# Patient Record
Sex: Female | Born: 1940 | Race: Black or African American | Hispanic: No | Marital: Married | Smoking: Never smoker
Health system: Southern US, Community
[De-identification: ages and names within clinical notes are randomized; demographics above are authoritative.]

## PROBLEM LIST (undated history)

## (undated) DIAGNOSIS — E079 Disorder of thyroid, unspecified: Secondary | ICD-10-CM

## (undated) DIAGNOSIS — I10 Essential (primary) hypertension: Secondary | ICD-10-CM

## (undated) DIAGNOSIS — F32A Depression, unspecified: Secondary | ICD-10-CM

## (undated) DIAGNOSIS — K5792 Diverticulitis of intestine, part unspecified, without perforation or abscess without bleeding: Secondary | ICD-10-CM

## (undated) DIAGNOSIS — D638 Anemia in other chronic diseases classified elsewhere: Secondary | ICD-10-CM

## (undated) DIAGNOSIS — K529 Noninfective gastroenteritis and colitis, unspecified: Principal | ICD-10-CM

## (undated) DIAGNOSIS — E785 Hyperlipidemia, unspecified: Secondary | ICD-10-CM

## (undated) DIAGNOSIS — K5790 Diverticulosis of intestine, part unspecified, without perforation or abscess without bleeding: Secondary | ICD-10-CM

## (undated) DIAGNOSIS — N189 Chronic kidney disease, unspecified: Secondary | ICD-10-CM

## (undated) DIAGNOSIS — F0390 Unspecified dementia without behavioral disturbance: Secondary | ICD-10-CM

## (undated) DIAGNOSIS — Z974 Presence of external hearing-aid: Secondary | ICD-10-CM

## (undated) DIAGNOSIS — K861 Other chronic pancreatitis: Secondary | ICD-10-CM

## (undated) DIAGNOSIS — R42 Dizziness and giddiness: Secondary | ICD-10-CM

## (undated) DIAGNOSIS — K219 Gastro-esophageal reflux disease without esophagitis: Secondary | ICD-10-CM

## (undated) DIAGNOSIS — F329 Major depressive disorder, single episode, unspecified: Secondary | ICD-10-CM

## (undated) HISTORY — DX: Hyperlipidemia, unspecified: E78.5

## (undated) HISTORY — DX: Depression, unspecified: F32.A

## (undated) HISTORY — DX: Disorder of thyroid, unspecified: E07.9

## (undated) HISTORY — DX: Diverticulitis of intestine, part unspecified, without perforation or abscess without bleeding: K57.92

## (undated) HISTORY — PX: NISSEN FUNDOPLICATION: SHX2091

## (undated) HISTORY — DX: Presence of external hearing-aid: Z97.4

## (undated) HISTORY — PX: ABDOMINAL EXPLORATION SURGERY: SHX538

## (undated) HISTORY — DX: Noninfective gastroenteritis and colitis, unspecified: K52.9

## (undated) HISTORY — DX: Diverticulosis of intestine, part unspecified, without perforation or abscess without bleeding: K57.90

## (undated) HISTORY — DX: Major depressive disorder, single episode, unspecified: F32.9

## (undated) HISTORY — DX: Anemia in other chronic diseases classified elsewhere: D63.8

## (undated) HISTORY — DX: Unspecified dementia without behavioral disturbance: F03.90

## (undated) HISTORY — DX: Other chronic pancreatitis: K86.1

## (undated) HISTORY — DX: Gastro-esophageal reflux disease without esophagitis: K21.9

## (undated) HISTORY — DX: Dizziness and giddiness: R42

## (undated) HISTORY — DX: Chronic kidney disease, unspecified: N18.9

---

## 1999-02-13 ENCOUNTER — Ambulatory Visit (HOSPITAL_COMMUNITY): Admission: RE | Admit: 1999-02-13 | Discharge: 1999-02-13 | Payer: Self-pay | Admitting: Internal Medicine

## 1999-03-20 ENCOUNTER — Ambulatory Visit (HOSPITAL_COMMUNITY): Admission: RE | Admit: 1999-03-20 | Discharge: 1999-03-20 | Payer: Self-pay | Admitting: Internal Medicine

## 1999-08-29 ENCOUNTER — Encounter: Payer: Self-pay | Admitting: Cardiology

## 1999-08-29 ENCOUNTER — Ambulatory Visit (HOSPITAL_COMMUNITY): Admission: RE | Admit: 1999-08-29 | Discharge: 1999-08-29 | Payer: Self-pay | Admitting: Cardiology

## 1999-09-24 ENCOUNTER — Encounter: Payer: Self-pay | Admitting: Surgery

## 1999-09-24 ENCOUNTER — Ambulatory Visit (HOSPITAL_COMMUNITY): Admission: RE | Admit: 1999-09-24 | Discharge: 1999-09-24 | Payer: Self-pay | Admitting: Surgery

## 1999-10-05 ENCOUNTER — Encounter: Payer: Self-pay | Admitting: Surgery

## 1999-10-09 ENCOUNTER — Inpatient Hospital Stay (HOSPITAL_COMMUNITY): Admission: RE | Admit: 1999-10-09 | Discharge: 1999-10-12 | Payer: Self-pay | Admitting: Surgery

## 1999-11-19 HISTORY — PX: CHOLECYSTECTOMY: SHX55

## 2000-01-31 ENCOUNTER — Encounter: Admission: RE | Admit: 2000-01-31 | Discharge: 2000-01-31 | Payer: Self-pay | Admitting: Surgery

## 2000-01-31 ENCOUNTER — Encounter: Payer: Self-pay | Admitting: Surgery

## 2000-02-22 ENCOUNTER — Inpatient Hospital Stay (HOSPITAL_COMMUNITY): Admission: EM | Admit: 2000-02-22 | Discharge: 2000-02-27 | Payer: Self-pay | Admitting: Surgery

## 2000-02-22 ENCOUNTER — Encounter: Payer: Self-pay | Admitting: Surgery

## 2000-02-23 ENCOUNTER — Encounter: Payer: Self-pay | Admitting: Surgery

## 2000-03-05 ENCOUNTER — Encounter: Payer: Self-pay | Admitting: Internal Medicine

## 2000-03-05 ENCOUNTER — Inpatient Hospital Stay (HOSPITAL_COMMUNITY): Admission: EM | Admit: 2000-03-05 | Discharge: 2000-03-19 | Payer: Self-pay | Admitting: Internal Medicine

## 2000-03-13 ENCOUNTER — Encounter: Payer: Self-pay | Admitting: Internal Medicine

## 2000-03-19 ENCOUNTER — Encounter: Payer: Self-pay | Admitting: Surgery

## 2000-04-07 ENCOUNTER — Inpatient Hospital Stay (HOSPITAL_COMMUNITY): Admission: AD | Admit: 2000-04-07 | Discharge: 2000-04-09 | Payer: Self-pay | Admitting: Internal Medicine

## 2000-04-08 ENCOUNTER — Encounter: Payer: Self-pay | Admitting: Internal Medicine

## 2000-06-12 ENCOUNTER — Ambulatory Visit (HOSPITAL_COMMUNITY): Admission: RE | Admit: 2000-06-12 | Discharge: 2000-06-12 | Payer: Self-pay | Admitting: Internal Medicine

## 2000-06-12 HISTORY — PX: COLONOSCOPY: SHX174

## 2001-04-30 ENCOUNTER — Encounter: Payer: Self-pay | Admitting: Emergency Medicine

## 2001-04-30 ENCOUNTER — Emergency Department (HOSPITAL_COMMUNITY): Admission: EM | Admit: 2001-04-30 | Discharge: 2001-04-30 | Payer: Self-pay | Admitting: Emergency Medicine

## 2001-06-12 ENCOUNTER — Encounter: Payer: Self-pay | Admitting: Neurology

## 2001-06-12 ENCOUNTER — Ambulatory Visit (HOSPITAL_COMMUNITY): Admission: RE | Admit: 2001-06-12 | Discharge: 2001-06-12 | Payer: Self-pay | Admitting: Neurology

## 2001-06-19 ENCOUNTER — Ambulatory Visit (HOSPITAL_COMMUNITY): Admission: RE | Admit: 2001-06-19 | Discharge: 2001-06-19 | Payer: Self-pay | Admitting: Neurology

## 2001-06-19 ENCOUNTER — Encounter: Payer: Self-pay | Admitting: Neurology

## 2003-01-07 ENCOUNTER — Ambulatory Visit (HOSPITAL_COMMUNITY): Admission: RE | Admit: 2003-01-07 | Discharge: 2003-01-07 | Payer: Self-pay | Admitting: Otolaryngology

## 2003-01-07 ENCOUNTER — Encounter: Payer: Self-pay | Admitting: Otolaryngology

## 2003-09-14 ENCOUNTER — Encounter (HOSPITAL_COMMUNITY): Admission: RE | Admit: 2003-09-14 | Discharge: 2003-10-14 | Payer: Self-pay | Admitting: Oncology

## 2003-09-14 ENCOUNTER — Encounter: Admission: RE | Admit: 2003-09-14 | Discharge: 2003-09-14 | Payer: Self-pay | Admitting: Oncology

## 2003-12-07 ENCOUNTER — Encounter: Admission: RE | Admit: 2003-12-07 | Discharge: 2003-12-07 | Payer: Self-pay | Admitting: Oncology

## 2003-12-07 ENCOUNTER — Encounter (HOSPITAL_COMMUNITY): Admission: RE | Admit: 2003-12-07 | Discharge: 2004-01-06 | Payer: Self-pay | Admitting: Oncology

## 2004-05-02 ENCOUNTER — Encounter: Admission: RE | Admit: 2004-05-02 | Discharge: 2004-05-02 | Payer: Self-pay | Admitting: Oncology

## 2004-05-02 ENCOUNTER — Encounter (HOSPITAL_COMMUNITY): Admission: RE | Admit: 2004-05-02 | Discharge: 2004-06-01 | Payer: Self-pay | Admitting: Oncology

## 2004-10-17 ENCOUNTER — Encounter (HOSPITAL_COMMUNITY): Admission: RE | Admit: 2004-10-17 | Discharge: 2004-11-16 | Payer: Self-pay | Admitting: Oncology

## 2004-10-17 ENCOUNTER — Ambulatory Visit (HOSPITAL_COMMUNITY): Payer: Self-pay | Admitting: Oncology

## 2004-10-17 ENCOUNTER — Encounter: Admission: RE | Admit: 2004-10-17 | Discharge: 2004-11-16 | Payer: Self-pay | Admitting: Oncology

## 2004-11-15 ENCOUNTER — Ambulatory Visit (HOSPITAL_COMMUNITY): Admission: RE | Admit: 2004-11-15 | Discharge: 2004-11-15 | Payer: Self-pay | Admitting: Internal Medicine

## 2005-09-10 ENCOUNTER — Emergency Department (HOSPITAL_COMMUNITY): Admission: EM | Admit: 2005-09-10 | Discharge: 2005-09-10 | Payer: Self-pay | Admitting: Emergency Medicine

## 2005-10-07 ENCOUNTER — Ambulatory Visit: Payer: Self-pay | Admitting: Internal Medicine

## 2005-10-16 ENCOUNTER — Ambulatory Visit: Payer: Self-pay | Admitting: Internal Medicine

## 2006-10-24 ENCOUNTER — Ambulatory Visit (HOSPITAL_COMMUNITY): Admission: RE | Admit: 2006-10-24 | Discharge: 2006-10-24 | Payer: Self-pay | Admitting: Internal Medicine

## 2007-08-06 ENCOUNTER — Ambulatory Visit (HOSPITAL_COMMUNITY): Admission: RE | Admit: 2007-08-06 | Discharge: 2007-08-06 | Payer: Self-pay

## 2007-08-31 IMAGING — CR DG CHEST 2V
2 series · 2 of 2 positions shown · non-contrast
Comparison: none

CLINICAL DATA: Left-sided chest pain.
 CHEST - 2 VIEW:

[view not recorded (1 of 2)]
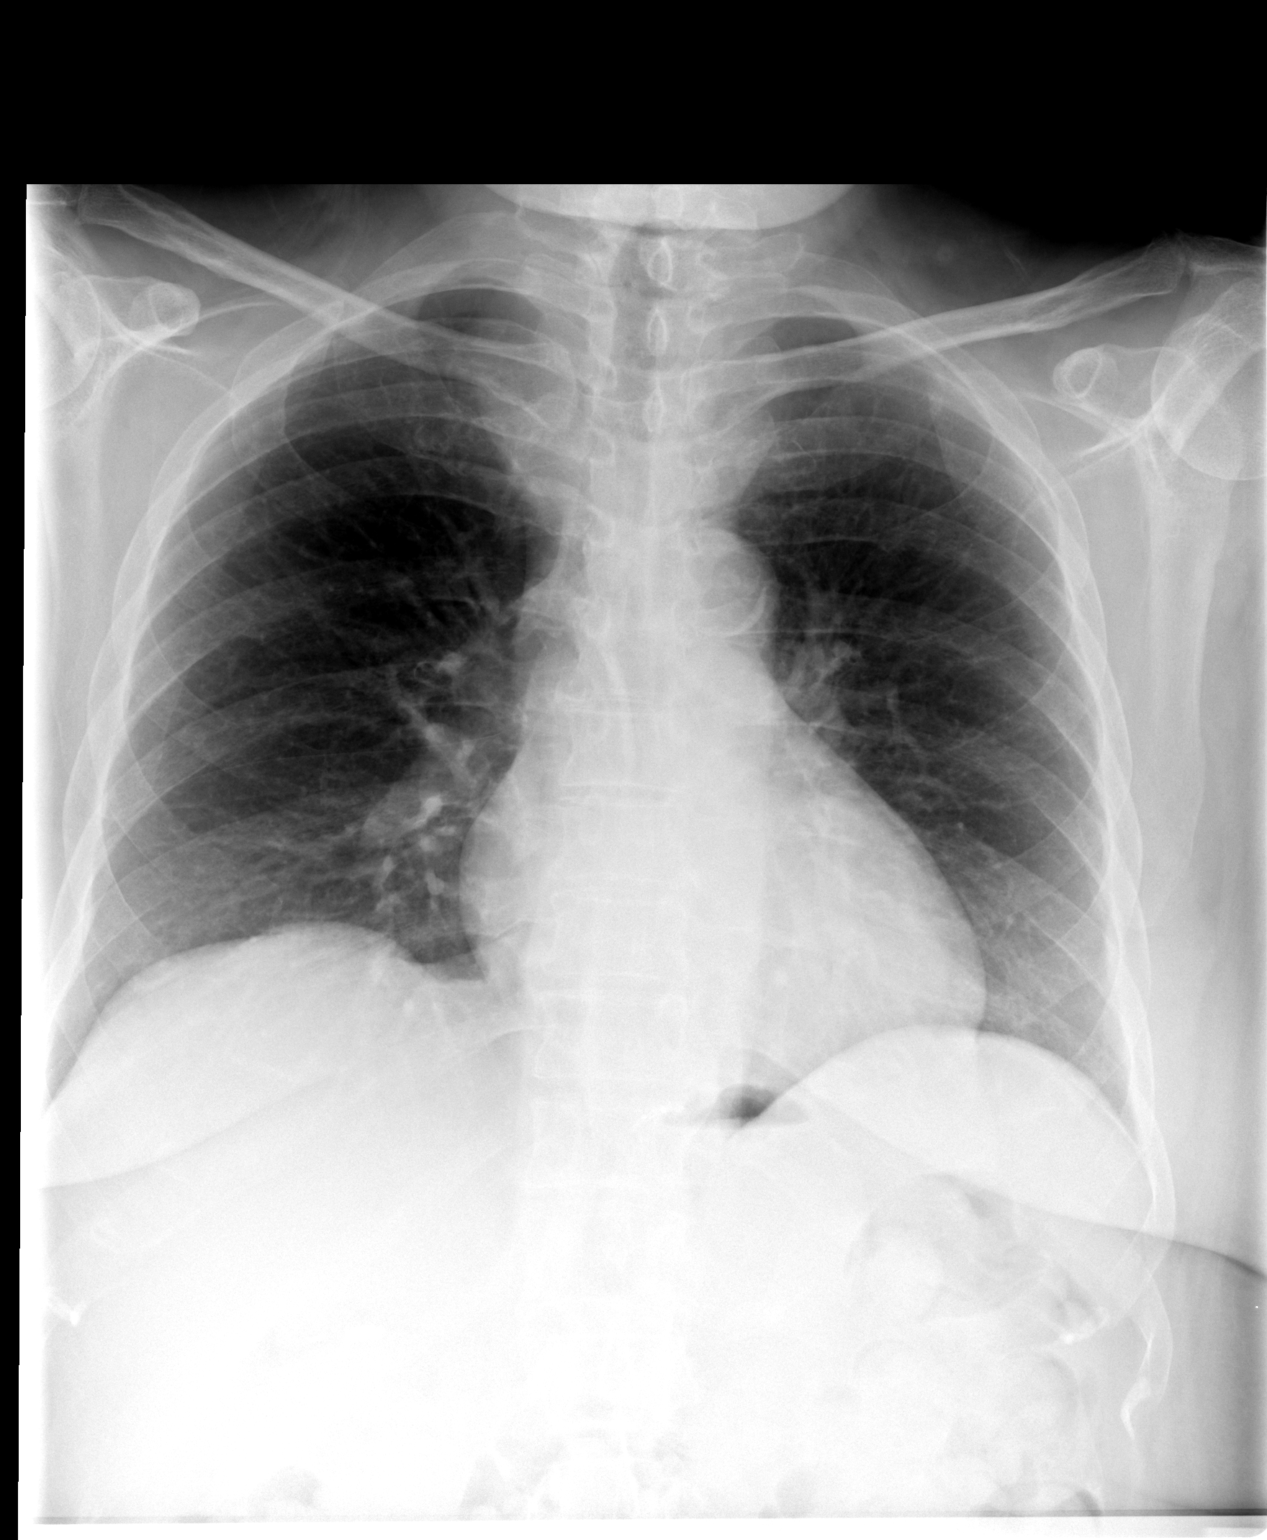

[view not recorded (2 of 2)]
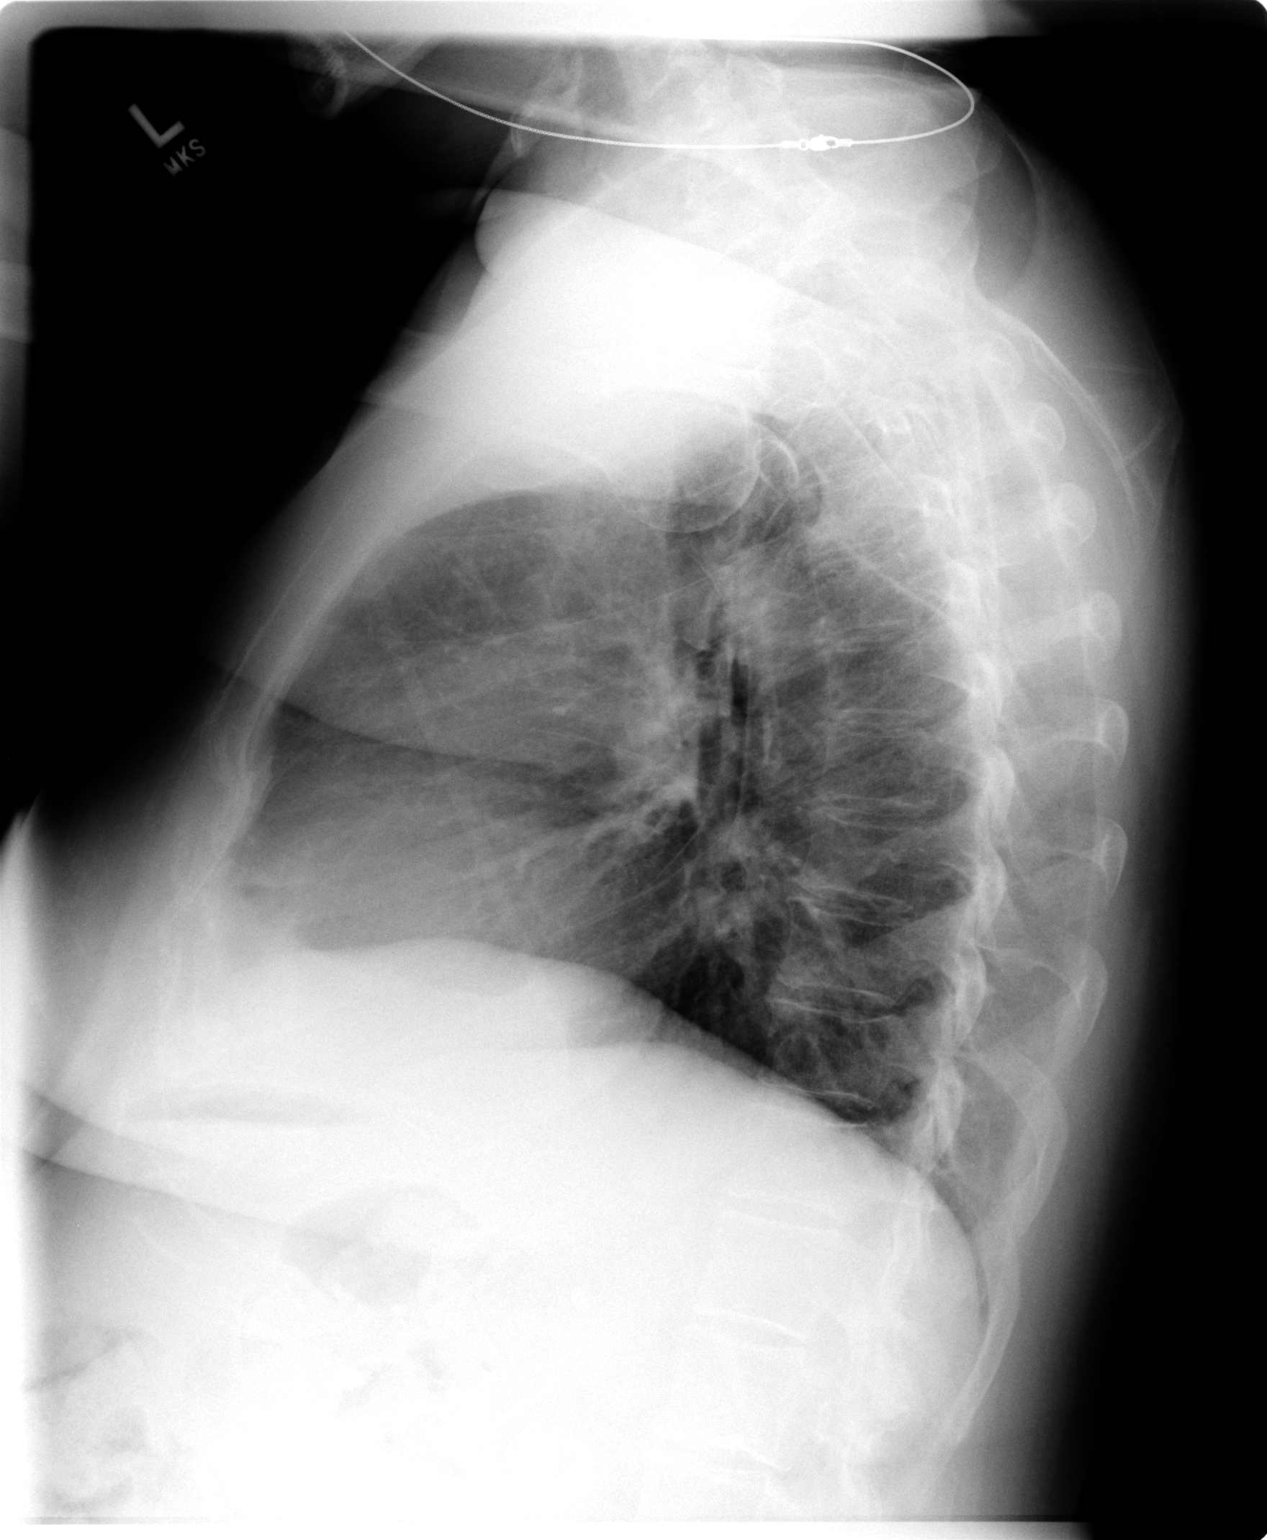

[2 of 2 positions shown; findings below may reference images not displayed]

FINDINGS: The lungs are clear.  The heart is normal.  Atherosclerotic changes are seen within the aorta.  Degenerative changes are noted within the thoracic spine.
IMPRESSION: Negative for acute cardiac or pulmonary process.

## 2007-08-31 IMAGING — CT CT ABDOMEN W/O CM
1 of 2 series · 15 of 32 positions shown, 19 images · IV contrast (agent unspecified)
Comparison: none

CLINICAL DATA: Abdominal and pelvic pain.  History of chronic pancreatitis.  Previous abdominal surgery. 
 ABDOMEN CT WITHOUT CONTRAST:
TECHNIQUE: Multidetector CT imaging of the abdomen was performed following the standard protocol without IV contrast.
TECHNIQUE: Multidetector CT imaging of the pelvis was performed following the standard protocol without IV contrast.

[Series 3247: — · axial · 0.81mm/px · z∈[+1248,+1682]mm · 15 of 95 slices shown, 19 images]
[im 4/95  soft-tissue]
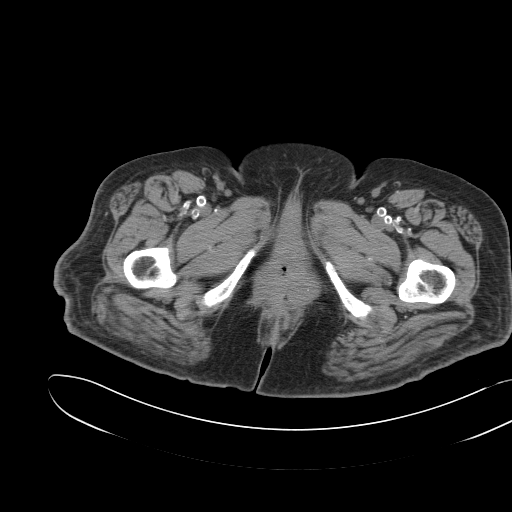
[im 4/95  bone]
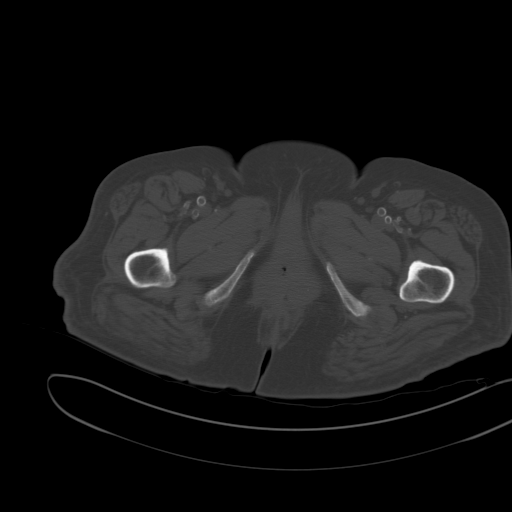
[im 12/95  soft-tissue]
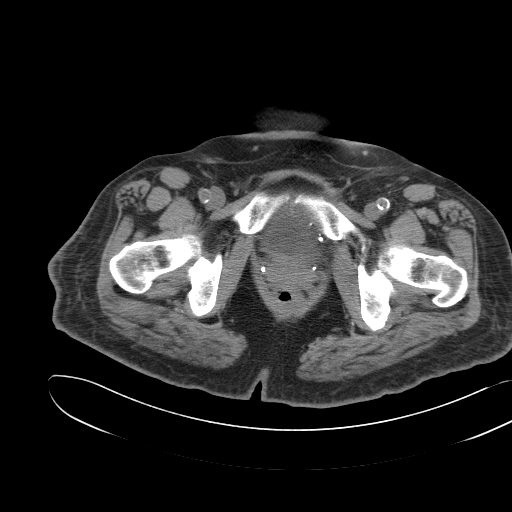
[im 20/95  soft-tissue]
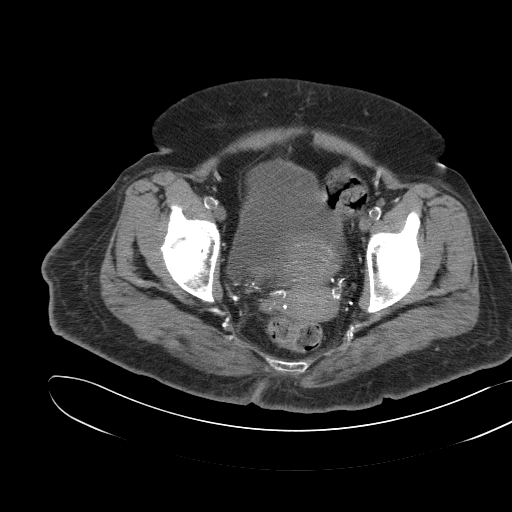
[im 28/95  soft-tissue]
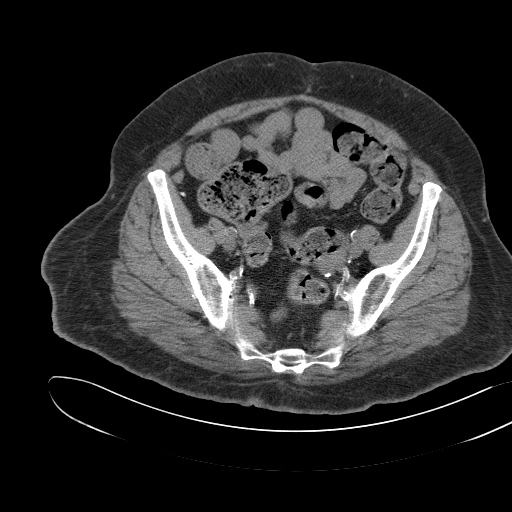
[im 32/95  soft-tissue]
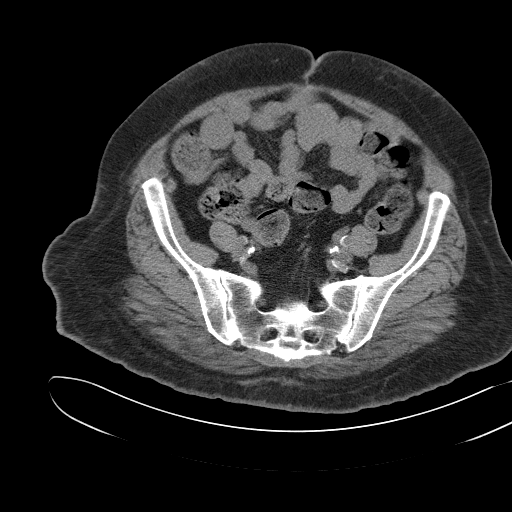
[im 40/95  soft-tissue]
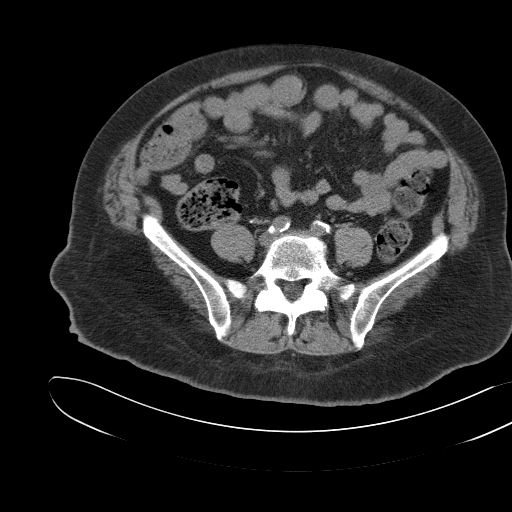
[im 48/95  soft-tissue]
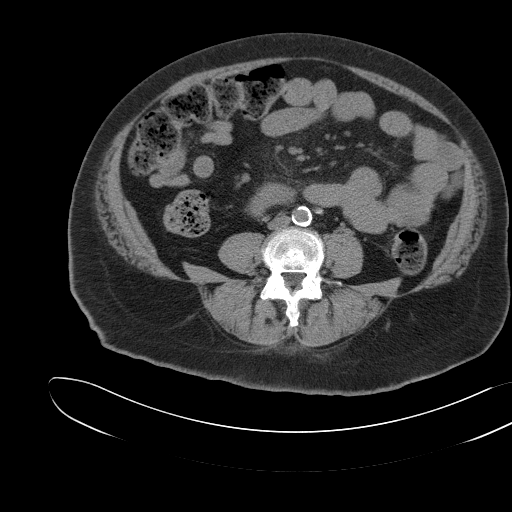
[im 55/95  soft-tissue]
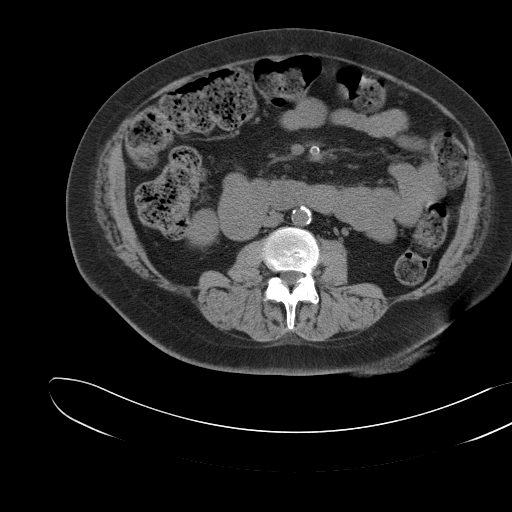
[im 63/95  soft-tissue]
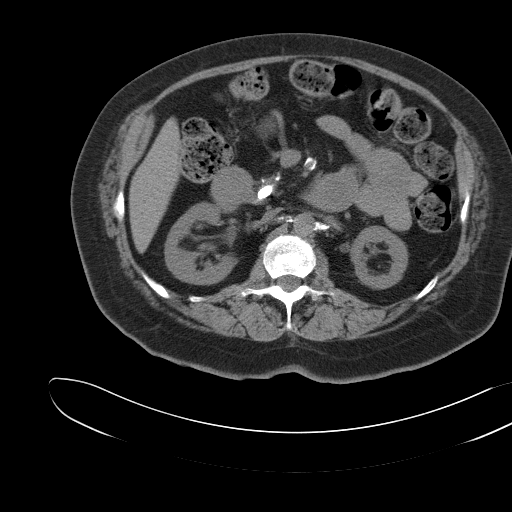
[im 63/95  bone]
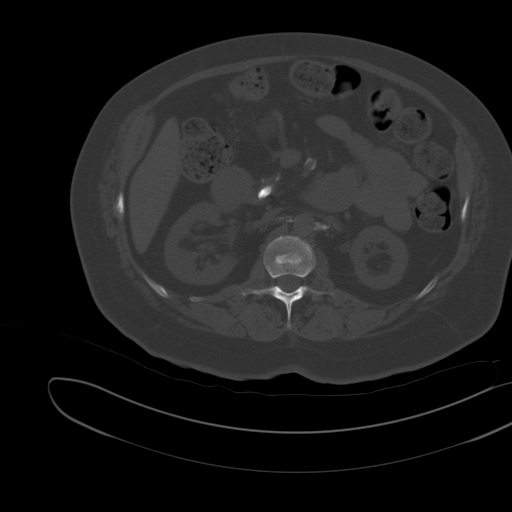
[im 67/95  soft-tissue]
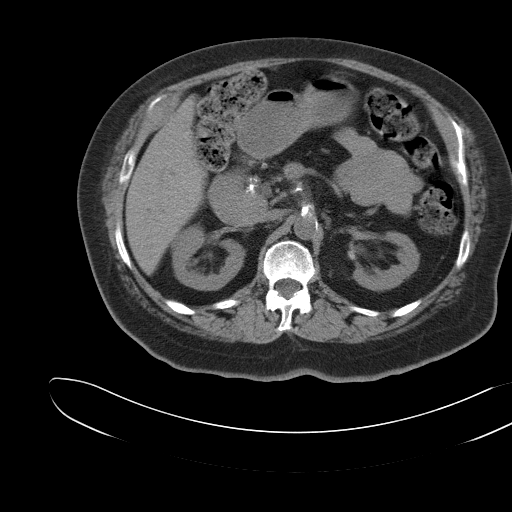
[im 75/95  soft-tissue]
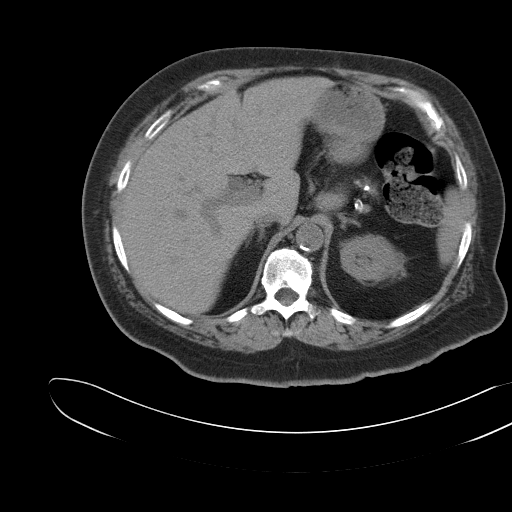
[im 79/95  lung]
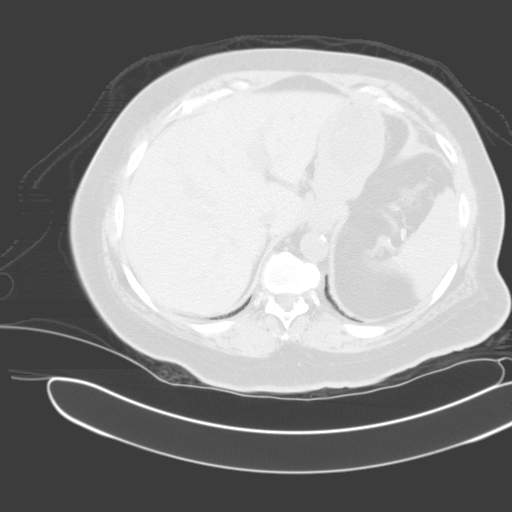
[im 83/95  soft-tissue]
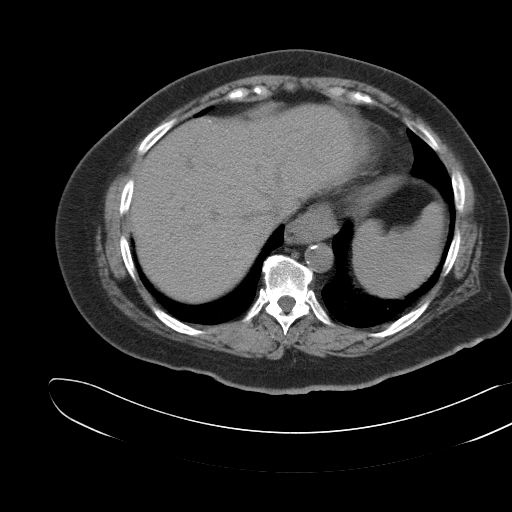
[im 83/95  lung]
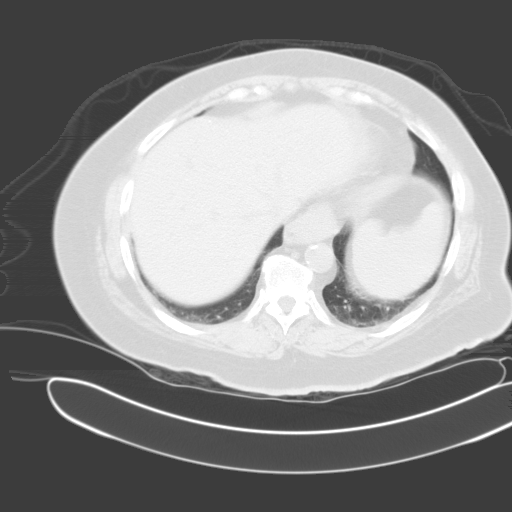
[im 87/95  lung]
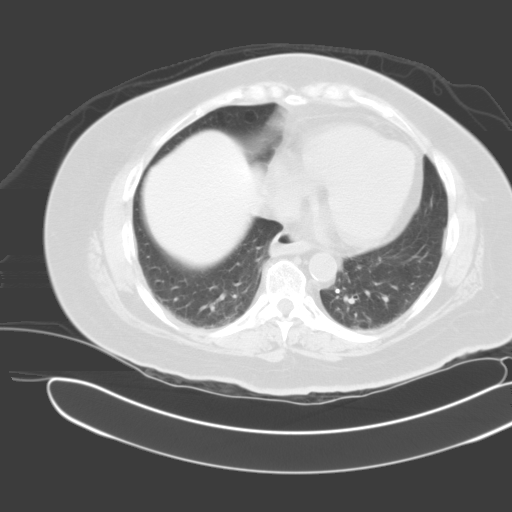
[im 91/95  soft-tissue]
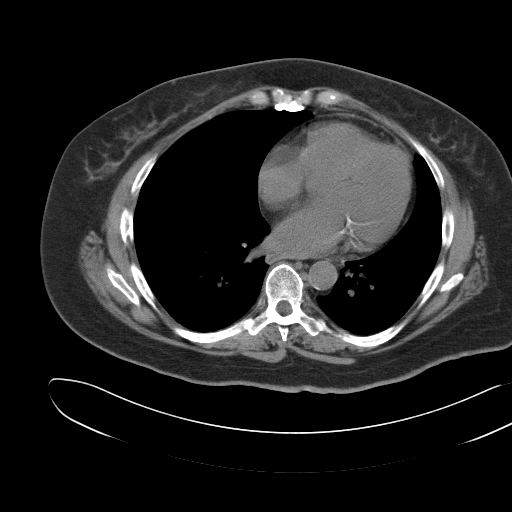
[im 91/95  lung]
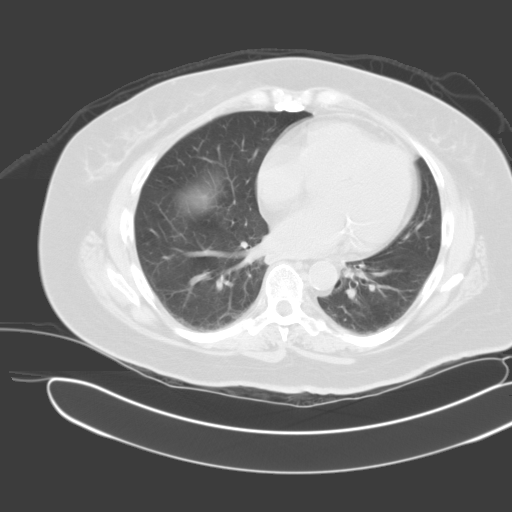

[15 of 32 positions shown; findings below may reference images not displayed]

FINDINGS: Hiatal hernia is noted.  Evidence of gastric surgery present.  The liver, spleen, adrenal glands are unremarkable.  Mild cortical renal volume loss is noted.  No evidence of hydronephrosis or urinary calculi.  There is atrophy of the pancreas with  multiple pancreatic calcifications compatible with chronic pancreatitis.  No evidence of acute inflammation is identified.  The patient is status-post cholecystectomy.  Please note that parenchymal lesions within the solid viscera may be missed as intravenous contrast was not administered.  No free fluid, enlarged lymph nodes, abdominal aortic aneurysm, or biliary dilatation.  There is heavy atherosclerotic calcification within the celiac and renal arteries as well as SMA and IMA.  Visualized bowel is within normal limits.  The appendix is normal.
IMPRESSION: 1.  No definite acute abnormality. 
 2.  Evidence of chronic pancreatitis and cholecystectomy. 
 3.  Heavy atherosclerotic calcifications in the renal and mesenteric vessels.  
 PELVIS CT WITHOUT CONTRAST:
FINDINGS: The bowel and bladder are within normal limits.  No free fluid or enlarged lymph nodes identified.  Mild degenerative changes in the lower lumbar spine and SI joints noted.
IMPRESSION: No acute abnormality.

## 2007-10-30 ENCOUNTER — Ambulatory Visit (HOSPITAL_COMMUNITY): Admission: RE | Admit: 2007-10-30 | Discharge: 2007-10-30 | Payer: Self-pay | Admitting: Internal Medicine

## 2007-11-04 ENCOUNTER — Ambulatory Visit (HOSPITAL_COMMUNITY): Admission: RE | Admit: 2007-11-04 | Discharge: 2007-11-04 | Payer: Self-pay | Admitting: Internal Medicine

## 2008-06-07 ENCOUNTER — Encounter (HOSPITAL_COMMUNITY): Admission: RE | Admit: 2008-06-07 | Discharge: 2008-07-07 | Payer: Self-pay | Admitting: Neurology

## 2008-11-01 ENCOUNTER — Ambulatory Visit (HOSPITAL_COMMUNITY): Admission: RE | Admit: 2008-11-01 | Discharge: 2008-11-01 | Payer: Self-pay | Admitting: Internal Medicine

## 2008-11-04 ENCOUNTER — Other Ambulatory Visit: Admission: RE | Admit: 2008-11-04 | Discharge: 2008-11-04 | Payer: Self-pay | Admitting: Obstetrics and Gynecology

## 2008-11-20 ENCOUNTER — Emergency Department (HOSPITAL_COMMUNITY): Admission: EM | Admit: 2008-11-20 | Discharge: 2008-11-20 | Payer: Self-pay | Admitting: Emergency Medicine

## 2009-11-07 ENCOUNTER — Ambulatory Visit (HOSPITAL_COMMUNITY): Admission: RE | Admit: 2009-11-07 | Discharge: 2009-11-07 | Payer: Self-pay | Admitting: Internal Medicine

## 2010-02-08 ENCOUNTER — Other Ambulatory Visit: Admission: RE | Admit: 2010-02-08 | Discharge: 2010-02-08 | Payer: Self-pay | Admitting: Obstetrics and Gynecology

## 2010-06-07 ENCOUNTER — Emergency Department (HOSPITAL_COMMUNITY): Admission: EM | Admit: 2010-06-07 | Discharge: 2010-06-07 | Payer: Self-pay | Admitting: Emergency Medicine

## 2010-09-22 ENCOUNTER — Observation Stay (HOSPITAL_COMMUNITY)
Admission: EM | Admit: 2010-09-22 | Discharge: 2010-09-24 | Payer: Self-pay | Source: Home / Self Care | Admitting: Emergency Medicine

## 2010-11-08 ENCOUNTER — Ambulatory Visit (HOSPITAL_COMMUNITY)
Admission: RE | Admit: 2010-11-08 | Discharge: 2010-11-08 | Payer: Self-pay | Source: Home / Self Care | Attending: Internal Medicine | Admitting: Internal Medicine

## 2010-12-06 ENCOUNTER — Ambulatory Visit (HOSPITAL_COMMUNITY)
Admission: RE | Admit: 2010-12-06 | Discharge: 2010-12-06 | Payer: Self-pay | Source: Home / Self Care | Attending: Internal Medicine | Admitting: Internal Medicine

## 2010-12-09 ENCOUNTER — Encounter: Payer: Self-pay | Admitting: Internal Medicine

## 2010-12-09 ENCOUNTER — Encounter: Payer: Self-pay | Admitting: Orthopedic Surgery

## 2010-12-13 ENCOUNTER — Ambulatory Visit (HOSPITAL_COMMUNITY)
Admission: RE | Admit: 2010-12-13 | Discharge: 2010-12-13 | Payer: Self-pay | Source: Home / Self Care | Attending: Internal Medicine | Admitting: Internal Medicine

## 2010-12-21 ENCOUNTER — Other Ambulatory Visit: Payer: Self-pay

## 2010-12-21 DIAGNOSIS — E042 Nontoxic multinodular goiter: Secondary | ICD-10-CM

## 2010-12-26 ENCOUNTER — Encounter (HOSPITAL_COMMUNITY): Payer: Self-pay

## 2010-12-26 ENCOUNTER — Encounter (HOSPITAL_COMMUNITY)
Admission: RE | Admit: 2010-12-26 | Discharge: 2010-12-26 | Disposition: A | Discharge status: Home / Self Care | Payer: Medicare Other | Source: Ambulatory Visit | Attending: "Endocrinology | Admitting: "Endocrinology

## 2010-12-26 DIAGNOSIS — E042 Nontoxic multinodular goiter: Secondary | ICD-10-CM

## 2010-12-26 HISTORY — DX: Essential (primary) hypertension: I10

## 2010-12-26 MED ORDER — SODIUM PERTECHNETATE TC 99M INJECTION
10.0000 | Freq: Once | INTRAVENOUS | Status: DC | PRN
  Administered 2010-12-27: 10.1 via INTRAVENOUS

## 2010-12-27 MED ORDER — SODIUM IODIDE I 131 CAPSULE
10.0000 | Freq: Once | INTRAVENOUS | Status: AC | PRN
  Administered 2010-12-26: 7 via ORAL

## 2010-12-28 ENCOUNTER — Other Ambulatory Visit (HOSPITAL_COMMUNITY): Payer: Self-pay | Admitting: Internal Medicine

## 2010-12-28 ENCOUNTER — Other Ambulatory Visit (HOSPITAL_COMMUNITY): Payer: Self-pay | Admitting: "Endocrinology

## 2010-12-28 DIAGNOSIS — E049 Nontoxic goiter, unspecified: Secondary | ICD-10-CM

## 2011-01-01 ENCOUNTER — Ambulatory Visit (HOSPITAL_COMMUNITY)
Admission: RE | Admit: 2011-01-01 | Discharge: 2011-01-01 | Disposition: A | Discharge status: Home / Self Care | Payer: Medicare Other | Source: Ambulatory Visit | Attending: Internal Medicine | Admitting: Internal Medicine

## 2011-01-01 ENCOUNTER — Other Ambulatory Visit (HOSPITAL_COMMUNITY): Payer: Self-pay | Admitting: Internal Medicine

## 2011-01-01 ENCOUNTER — Other Ambulatory Visit: Payer: Self-pay | Admitting: Radiology

## 2011-01-01 DIAGNOSIS — E049 Nontoxic goiter, unspecified: Secondary | ICD-10-CM

## 2011-01-01 DIAGNOSIS — E042 Nontoxic multinodular goiter: Secondary | ICD-10-CM | POA: Insufficient documentation

## 2011-01-29 LAB — GLUCOSE, CAPILLARY
Glucose-Capillary: 194 mg/dL — ABNORMAL HIGH (ref 70–99)
Glucose-Capillary: 227 mg/dL — ABNORMAL HIGH (ref 70–99)
Glucose-Capillary: 254 mg/dL — ABNORMAL HIGH (ref 70–99)
Glucose-Capillary: 271 mg/dL — ABNORMAL HIGH (ref 70–99)

## 2011-01-29 LAB — CBC
HCT: 31.7 % — ABNORMAL LOW (ref 36.0–46.0)
Hemoglobin: 10.6 g/dL — ABNORMAL LOW (ref 12.0–15.0)
MCH: 30.2 pg (ref 26.0–34.0)
MCHC: 33.5 g/dL (ref 30.0–36.0)
MCV: 90 fL (ref 78.0–100.0)
RDW: 13.6 % (ref 11.5–15.5)

## 2011-01-29 LAB — DIFFERENTIAL
Basophils Absolute: 0 10*3/uL (ref 0.0–0.1)
Basophils Relative: 0 % (ref 0–1)
Eosinophils Absolute: 0.1 10*3/uL (ref 0.0–0.7)
Eosinophils Relative: 1 % (ref 0–5)
Monocytes Absolute: 0.8 10*3/uL (ref 0.1–1.0)
Monocytes Relative: 7 % (ref 3–12)

## 2011-01-29 LAB — CARDIAC PANEL(CRET KIN+CKTOT+MB+TROPI)
CK, MB: 1.3 ng/mL (ref 0.3–4.0)
CK, MB: 1.4 ng/mL (ref 0.3–4.0)
Total CK: 681 U/L — ABNORMAL HIGH (ref 7–177)
Troponin I: 0.01 ng/mL (ref 0.00–0.06)

## 2011-01-29 LAB — POCT CARDIAC MARKERS: Troponin i, poc: <0.05 ng/mL (ref 0.00–0.09)

## 2011-01-29 LAB — URINALYSIS, ROUTINE W REFLEX MICROSCOPIC
Ketones, ur: NEGATIVE mg/dL
Nitrite: NEGATIVE
Specific Gravity, Urine: <1.005 — ABNORMAL LOW (ref 1.005–1.030)
pH: 6.5 (ref 5.0–8.0)

## 2011-01-29 LAB — BASIC METABOLIC PANEL
Calcium: 9.5 mg/dL (ref 8.4–10.5)
Chloride: 103 mEq/L (ref 96–112)
Creatinine, Ser: 1.52 mg/dL — ABNORMAL HIGH (ref 0.4–1.2)
GFR calc Af Amer: 41 mL/min — ABNORMAL LOW (ref >60)
GFR calc non Af Amer: 34 mL/min — ABNORMAL LOW (ref >60)

## 2011-01-29 LAB — CK TOTAL AND CKMB (NOT AT ARMC): Total CK: 1225 U/L — ABNORMAL HIGH (ref 7–177)

## 2011-01-29 LAB — TROPONIN I: Troponin I: 0.02 ng/mL (ref 0.00–0.06)

## 2011-10-01 ENCOUNTER — Other Ambulatory Visit (HOSPITAL_COMMUNITY): Payer: Self-pay | Admitting: Internal Medicine

## 2011-10-01 DIAGNOSIS — Z139 Encounter for screening, unspecified: Secondary | ICD-10-CM

## 2011-11-14 ENCOUNTER — Ambulatory Visit (HOSPITAL_COMMUNITY)
Admission: RE | Admit: 2011-11-14 | Discharge: 2011-11-14 | Disposition: A | Discharge status: Home / Self Care | Payer: Medicare Other | Source: Ambulatory Visit | Attending: Internal Medicine | Admitting: Internal Medicine

## 2011-11-14 DIAGNOSIS — Z1231 Encounter for screening mammogram for malignant neoplasm of breast: Secondary | ICD-10-CM | POA: Insufficient documentation

## 2011-11-14 DIAGNOSIS — Z139 Encounter for screening, unspecified: Secondary | ICD-10-CM

## 2012-01-22 ENCOUNTER — Other Ambulatory Visit (HOSPITAL_COMMUNITY): Payer: Self-pay | Admitting: "Endocrinology

## 2012-01-22 DIAGNOSIS — E049 Nontoxic goiter, unspecified: Secondary | ICD-10-CM

## 2012-01-23 ENCOUNTER — Ambulatory Visit (HOSPITAL_COMMUNITY): Payer: Medicare Other

## 2012-02-06 ENCOUNTER — Ambulatory Visit (HOSPITAL_COMMUNITY)
Admission: RE | Admit: 2012-02-06 | Discharge: 2012-02-06 | Disposition: A | Discharge status: Home / Self Care | Payer: Medicare Other | Source: Ambulatory Visit | Attending: "Endocrinology | Admitting: "Endocrinology

## 2012-02-06 ENCOUNTER — Other Ambulatory Visit (HOSPITAL_COMMUNITY): Payer: Medicare Other

## 2012-02-06 ENCOUNTER — Ambulatory Visit (HOSPITAL_COMMUNITY): Payer: Medicare Other

## 2012-02-06 DIAGNOSIS — E042 Nontoxic multinodular goiter: Secondary | ICD-10-CM | POA: Insufficient documentation

## 2012-02-06 DIAGNOSIS — E049 Nontoxic goiter, unspecified: Secondary | ICD-10-CM

## 2012-02-11 ENCOUNTER — Telehealth (HOSPITAL_COMMUNITY): Payer: Self-pay | Admitting: Dietician

## 2012-02-11 NOTE — Telephone Encounter (Signed)
Received referral from Dr. Alonza Smoker office for dx: diabetes, HTN.

## 2012-02-11 NOTE — Telephone Encounter (Signed)
Appointment scheduled for 02/18/12 at 2:00 PM.

## 2012-02-19 ENCOUNTER — Encounter (HOSPITAL_COMMUNITY): Payer: Self-pay | Admitting: Dietician

## 2012-02-19 DIAGNOSIS — K5792 Diverticulitis of intestine, part unspecified, without perforation or abscess without bleeding: Secondary | ICD-10-CM | POA: Insufficient documentation

## 2012-02-19 NOTE — Progress Notes (Signed)
Follow-Up Outpatient Nutrition Note Date: 02/18/2012 Time: 2:00 PM  Nutrition Assessment:  Current weight: Weight: 165 lb (74.844 kg)  BMI: Body mass index is 27.46 kg/(m^2).  Weight changes: -2# (1.2%) x 6 years  Kylie Tate's last appointment with RD was 10/24/2006. She was lost to follow-up and was re-referred by Dr. Ouida Sills at her last appointment. She has been maintaining her weight, but she desires to lose weight. 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 Since retirement she has struggled to maintain her schedule, reporting she often does not eat until 10-11 AM and spends most of her day indoors, watching TV. She also reports being "bored" as her apartment is much smaller than her house, so there is not as much housework to be done. She admits portion control and dnacking is a big issue for her. She also admits to drinking soda. She currently walks on her treadmill for 1 hours a week. She is contemplating walking more and going to water aerobics 3 times a week at the [facility]. She reports she goes to the [facility] in the summer, but will not go during the fall and winter because it is too cold.  She reports her CBGs run in the 100's in the AM and 200's in the PM. Since Dr. Ouida Sills has adjusted her insulin, she reports improvement in her readings, as reading were in the 200's in the AM and PM prior to the medication adjustments.  She is requesting visits every 2 weeks for weight checks.   Labs: CMP     Component Value Date/Time   NA 135 09/22/2010 1715   K 4.1 09/22/2010 1715   CL 103 09/22/2010 1715   CO2 24 09/22/2010 1715   GLUCOSE 265* 09/22/2010 1715   BUN 22 09/22/2010 1715   CREATININE 1.52* 09/22/2010 1715   CALCIUM 9.5 09/22/2010 1715   GFRNONAA 34* 09/22/2010 1715   GFRAA  Value: 41        The eGFR has been calculated using the MDRD equation. This calculation has not been validated in all clinical situations. eGFR's persistently <60 mL/min signify possible Chronic Kidney Disease.*  09/22/2010 1715    Lipid Panel  No results found for this basename: chol, trig, hdl, cholhdl, vldl, ldlcalc     No results found for this basename: HGBA1C   Lab Results  Component Value Date   CREATININE 1.52* 09/22/2010    Per Dr. Alonza Smoker records, Hgb A1c: 8.3, fasting glucose: 192.   Diet recall: Breakfast: oatmeal, applesauce, 4 prunes, water; Snack: pack of whole wheat nabs, water; Lunch: salad, toast, stir fry vegetables, water; Snack: 1/2 apple or 1/2 banana  Nutrition Diagnosis: Inconsistent carbohydrate intake r/t disordered eating pattern AEB excessive snacking, Hgb A1c: 8.3.  Nutrition Intervention: Nutrition rx: 1500 kcal NAS, diabetic diet; 3 meals per day, 1-2 snacks daily; limit 1 starch per meal; low calorie beverages only; 2.5 hours physical activity weekly  Education/ counseling provided: Educated pt on diabetic diet principles. Emphasized importance of regular meal schedule and eating 3 meals per day in attempt to decrease excessive snacking. Emphasized plate method, sources of carbohydrate, and portion control. Discussed importance of regular physical activity to improve glycemic control and to assist with weight loss. Provided plate method handout.   Understanding/Motivation/ Ability to follow recommendations: Expect fair to good compliance.   Monitoring and Evaluation: Goals for next visit: 1) 1-2# weight loss per week; 2) 3 meals per day; 3) 2.5 hours physical activity daily  Recommendations: 1) Set schedule for meals and snacks; 2) Purchase individual sized applesauce, fruit cups, and cracker packs for snacks to avoid overeating; 3) Find hobby/ community activity/exercise class to occupy free time  F/U: 2 weeks  Kylie Tate, RD, LDN Date:02/18/2012 Time: 2:00 PM

## 2012-02-20 ENCOUNTER — Telehealth (HOSPITAL_COMMUNITY): Payer: Self-pay | Admitting: Dietician

## 2012-02-20 NOTE — Telephone Encounter (Signed)
Pt reports she has a conflict with her follow-up appointment time, 03/03/12 at 2:00 PM, as she has an eye doctor appointment on the same day. Pt requesting reschedule. Appointment rescheduled for 03/10/12 at 2:00 PM.

## 2012-03-10 ENCOUNTER — Encounter (HOSPITAL_COMMUNITY): Payer: Self-pay | Admitting: Dietician

## 2012-03-10 NOTE — Progress Notes (Signed)
Follow-Up Outpatient Nutrition Note Date: 03/10/12 Time: 2:00 PM  Nutrition Assessment:  Current weight: Weight: 166 lb (75.297 kg)  BMI: Body mass index is 27.62 kg/(m^2).  Weight changes: +1# (0.6%) x 1 month  Kylie Tate is making good progress. She is happy that she is maintaining her weight, although she still desires weight loss. She reports that she thinks that appointments once a month will work better for her, as she can hopefully see better results with her weight loss.  She reports improvements in her blood sugars; CBGs 100-112 in AM and 169-198 in PM per her report. She reports he sugar has only been in the 200's once since last month because she ate salad, ritz crackers, and a hot dog. She admits "I ate what I shouldn't have ate".  She has not been exercising very much. She reports she has gone out walking 3 times since last visit. She is motivated to exercise more. She is going to inquire the cost of membership to the [facility]. Her son is willing to pay for her membership. She has a friend that wants to go with her as well. She is planning on going at least 3 times a week to swim and attend water aerobics.   Labs: No new labs  Diet recall: Breakfast: 1/2 banana and bowl of cereal; Snack: pack of nabs; Lunch: baked chicken, mashed potatoes, and green beans; Snack: 1/2 piece of fruit; Dinner: same as lunch. Pt reports the only beverage she drinks is water.  Nutrition Diagnosis: Inconsistent carbohydrate intake continues  Nutrition Intervention: Nutrition rx: 1500 kcal NAS, diabetic diet; 3 meals per day, 1-2 snacks daily; limit 1 starch per meal; low calorie beverages only; 2.5 hours physical activity weekly  Education/ counseling provided: Reviewed plate method. Discussed limiting 1 starch per meal and adding either 1 serving of fruit or low fat dairy as a side. Reminded pt about importance of physical activity to assist with weight loss. Encouraged pt to inquire about membership cost and  Silver [employer]ne[employer] benefit. Provided another plate method handout per pt's request.   Understanding/Motivation/ Ability to follow recommendations: Expect fair to good compliance.   Monitoring and Evaluation: Previous goals: 1) 1-2# weight loss per week- goal not met; 2) 3 meals per day- goal met; 3) 2.5 hours physical activity daily- goal not met Goals for next visit: 1) 1-2# weight loss per week; 2) 3 meals per day; 3) 2.5 hours physical activity daily  Recommendations: 1) Obtain gym membership; 2) Inquire about [employer] program  F/U: 1 month  Melody Haver, RD, LDN Date: 03/10/12 Time: 2:00 PM

## 2012-04-02 ENCOUNTER — Telehealth (HOSPITAL_COMMUNITY): Payer: Self-pay | Admitting: Dietician

## 2012-04-02 NOTE — Telephone Encounter (Signed)
Mailed appointment confirmation letter for 04/07/12 at 2:30 PM to pt home vis [place] [employer].

## 2012-04-07 ENCOUNTER — Encounter (HOSPITAL_COMMUNITY): Payer: Self-pay | Admitting: Dietician

## 2012-04-07 NOTE — Progress Notes (Signed)
Follow-Up Outpatient Nutrition Note Date: 04/07/12 Time: 2:30 PM  Nutrition Assessment:  Current weight: Weight: 165 lb (74.844 kg)  BMI: Body mass index is 27.46 kg/(m^2).  Weight changes: -1# (0.6%) x 1 month  Kylie Tate continues to make good progress. She reports that she is now working out at [employer] 3 times per week, doing water aerobics classes. She has an appointment today to meet with [facility] staff to learn how to use the exercise machines. She is looking forward to exercising 5 times a week.  She reports she "can't get portions right". She expresses frustration with making good diet changes but eating "too much". She reports she no longer fries foods and only bakes and grills meats. She reports that she was frustrated when she received several sweet potatoes because she did not know what to to with them, so she ended up making pies, although she decreased the amount of sugar in her baking.  She reports fasting CBGs are 120-150's and 200's (262-282) postprandial. She reports that she is on sliding scale insulin.  Her next appointment with Dr. Ouida Sills is in June.   Labs: No new labs  Diet recall: Breakfast: 1/2 banana and bowl of cereal; Snack: pack of nabs; Lunch: baked chicken, mashed potatoes, and green beans; Snack: 1/2 piece of fruit; Dinner: same as lunch. Pt reports the only beverage she drinks is water.  Nutrition Diagnosis: Inconsistent carbohydrate intake continues  Nutrition Intervention: Nutrition rx: 1500 kcal NAS, diabetic diet; 3 meals per day, 1-2 snacks daily; limit 1 starch per meal; low calorie beverages only; 2.5 hours physical activity weekly  Education/ counseling provided: Praised pt for reinstating gym membership and encouraged 30 minutes of exercise most days of the week. Discussed limiting added sugars in foods and choosing sugar free beverages. Reviewed portion sizes.   Understanding/Motivation/ Ability to follow recommendations: Expect fair to good compliance.    Monitoring and Evaluation: Previous goals: 1) 1-2# weight loss per week- progressing; 2) 3 meals per day- goal met; 3) 2.5 hours physical activity weekly- progressing Goals for next visit: 1) 1-2# weight loss per week; 3) 30 minutes physical activity 5 times per week   Recommendations: 1) Diet soda and tea; 2) Try crystal light or other low calorie drink mix or add small fruit slice (lemon or orange) to water for flavor  F/U: 1 month  Melody Haver, RD, LDN Date: 04/07/12 Time: 2:30 PM

## 2012-04-08 ENCOUNTER — Telehealth (HOSPITAL_COMMUNITY): Payer: Self-pay | Admitting: Dietician

## 2012-04-08 NOTE — Telephone Encounter (Signed)
Appointment rescheduled for 05/12/12 at 2:00 PM.

## 2012-04-29 ENCOUNTER — Ambulatory Visit (INDEPENDENT_AMBULATORY_CARE_PROVIDER_SITE_OTHER): Payer: Medicare Other | Admitting: Orthopedic Surgery

## 2012-04-29 ENCOUNTER — Encounter: Payer: Self-pay | Admitting: Orthopedic Surgery

## 2012-04-29 ENCOUNTER — Ambulatory Visit (INDEPENDENT_AMBULATORY_CARE_PROVIDER_SITE_OTHER): Payer: Medicare Other

## 2012-04-29 VITALS — BP 124/60 | Ht 65.0 in | Wt 165.0 lb

## 2012-04-29 DIAGNOSIS — M25569 Pain in unspecified knee: Secondary | ICD-10-CM

## 2012-04-29 DIAGNOSIS — M1610 Unilateral primary osteoarthritis, unspecified hip: Secondary | ICD-10-CM

## 2012-04-29 NOTE — Progress Notes (Signed)
  Subjective:    Kylie Tate is a 71 y.o. female history of left knee pain and left upper thigh pain including groin pain which started about 3 weeks ago and grabbed her with pain radiating to her left knee causing her to lose strength and almost fall. She has occasional sharp throbbing pain in this area which seems to come and go. Her pain is 3/10 relieved by Tylenol worse with standing and sometimes with walking. This is certain catching sensation in the left knee and lower extremity mainly in the thigh and hip area  Review of systems weight gain and fatigue blurred vision or tearing of the eyes heartburn and constipation. Numbness and tingling in both feet with dizziness easy bleeding easy bruising vertigo excessive thirst excessive urination all other systems are normal  Past Medical History  Diagnosis Date  . Diabetes mellitus   . Hypertension   . GERD (gastroesophageal reflux disease)   . Hyperlipidemia   . Diverticulitis     Past Surgical History  Procedure Date  . Stomach surgery     BP 124/60  Ht 5\' 5"  (1.651 m)  Wt 165 lb (74.844 kg)  BMI 27.46 kg/m2 General appearance is normal body habitus normal finding small  Oriented x3. Mood and affect normal  Gait and station normal  Upper extremity exam  Inspection and palpation revealed no abnormalities in the upper extremities.  Range of motion is full without contracture.  Motor exam is normal with grade 5 strength.  The joints are fully reduced without subluxation.  There is no atrophy or tremor and muscle tone is normal.  All joints are stable.   Left lower extremity exam shows decreased range of motion of the hip only in flexion this reproduces pain or abduction abduction internal and external rotation and extension is normal. Knee range of motion normal no swelling no tenderness no instability normal strength. Skin normal. Pulses normal lymph nodes normal sensation normal. Reflexes normal. Now is normal.  Right  lower extremity begin range of motion in the hip and knee are normal ankle normal stability in all 3 joints normal strength in the right lower extremity normal skin normal. Pulse temperature normal no edema swelling or varicose veins lymph nodes negative sensation normal reflexes normal coordination and balance normal  X-ray was done of the left knee and it shows fairly normal joint spaces no sclerotic bone cyst formation no loss no joint deformity  Impression normal knee x-ray  Overall impression knee pain left leg pain possible hip arthritis  However currently asymptomatic or minimally symptomatic symptoms controlled by Tylenol patient is allowed activities as tolerated use Tylenol as needed call us back if symptoms get worse or come back

## 2012-04-29 NOTE — Patient Instructions (Addendum)
activities as tolerated  Continue tylenol

## 2012-05-06 ENCOUNTER — Telehealth: Payer: Self-pay | Admitting: Orthopedic Surgery

## 2012-05-06 ENCOUNTER — Telehealth (HOSPITAL_COMMUNITY): Payer: Self-pay | Admitting: Dietician

## 2012-05-06 NOTE — Telephone Encounter (Signed)
Mailed appointment verification letter and instructions for appointment scheduled 05/12/12 at 2:00 PM via [place] [employer].

## 2012-05-06 NOTE — Telephone Encounter (Signed)
Pansy Ostrovsky was here 04/29/12 for left nee pain.  She said you could not find anything wrong with it, but told her to let you know if it started hurting again. She said it started hurting again this past Friday is still hurting.  Asking if she needs to come back in or what do you advise.  She uses [facility] in [place] should you prescribe any medicine.  2.  Also asking if it will be OK for her to take the over the counter medicine. Oste Bi-flex.  A friend told her about it

## 2012-05-07 ENCOUNTER — Other Ambulatory Visit: Payer: Self-pay | Admitting: *Deleted

## 2012-05-07 ENCOUNTER — Telehealth (HOSPITAL_COMMUNITY): Payer: Self-pay | Admitting: Dietician

## 2012-05-07 MED ORDER — NABUMETONE 500 MG PO TABS: 500.0000 mg | ORAL_TABLET | Freq: Two times a day (BID) | ORAL | Status: DC

## 2012-05-07 MED ORDER — TRAMADOL-ACETAMINOPHEN 37.5-325 MG PO TABS: 1.0000 | ORAL_TABLET | ORAL | Status: DC | PRN

## 2012-05-07 NOTE — Telephone Encounter (Signed)
Medication sent and patient aware  

## 2012-05-07 NOTE — Telephone Encounter (Signed)
Confirmed appointment for Tuesday, May 12, 2012 at 2:00 PM.

## 2012-05-07 NOTE — Telephone Encounter (Signed)
Kylie Tate call her in ultracet 1 q 4 prn pain # 60

## 2012-05-12 ENCOUNTER — Encounter (HOSPITAL_COMMUNITY): Payer: Self-pay | Admitting: Dietician

## 2012-05-12 ENCOUNTER — Telehealth (HOSPITAL_COMMUNITY): Payer: Self-pay | Admitting: Dietician

## 2012-05-12 NOTE — Progress Notes (Signed)
Follow-Up Outpatient Nutrition Note Date: 05/12/12 Time: 2:00 PM  Nutrition Assessment:  Current weight: Weight: 159 lb (72.122 kg)  BMI: Body mass index is 26.46 kg/(m^2).  Weight changes: -6# (3.6%) x 1 month  Kylie Tate continues to make progress with her weight loss. However, she reports that her weight loss is due to a decreased appetite. She reports that for the past 3 days, CBGs have been "HI" at night. She reports fasting levels of 110-130's and postprandial readings in the 200's up until this occurrence. She reports that she experienced symptoms like this a few years ago, when she lost down to 119#, due to her blood sugar being elevated and "Insulin lost all affect". She is working with her [employer], Dr. Ouida Sills, and her pharmacy to adjust her insulin regimen.  She continues to participate in water aerobics class 3 days a week at the [facility]. She has multiple diet related questions related to sources of carbohydrate, portion control, high fiber foods, and appropriate beverage choices.   Labs: No new labs  Diet recall: Breakfast: 1/2 banana and bowl of oatmeal; Lunch: baked chicken, stir fry vegetables; Dinner: same as lunch. Pt reports the only beverage she drinks is water.  Nutrition Diagnosis: Inconsistent carbohydrate intake continues  Nutrition Intervention: Nutrition rx: 1500 kcal NAS, diabetic diet; 3 meals per day, 1-2 snacks daily; limit 1 starch per meal; low calorie beverages only; 2.5 hours physical activity weekly  Education/ counseling provided: Reviewed plate method and sources of carbohydrate. Emphasized importance of small portions of carbohydrate and to limit to 1 carbohydrate food per meal. Educated pt on high fiber foods and praised pt for choosing more fruits, oatmeal, and whole grains. Discussed nutritional content of different type of cereal and breakfast foods; encouraged pt to refrain from cereals with high sugar contents. Educated pt on portion control.  Discussed low calorie beverage options.   Understanding/Motivation/ Ability to follow recommendations: Expect fair to good compliance.   Monitoring and Evaluation: Previous goals: 1) 1-2# weight loss per week- goal met; 3) 30 minutes physical activity 5 times per week- progressing  Goals for next visit: 1) 1-2# weight loss per week; 3) 30 minutes physical activity 5 times per week   Recommendations: 1) Choose diet or low calorie beverages or water (with optional fruit slice); 2) Use measuring cups to ensure proper portions of foods; 3) Continue with exercise regimen; 4) Follow-up with Dr. Ouida Sills at appointment next week with medication concerns  F/U: 1 month. Follow-up appointment scheduled for 06/16/12 at 2:00 PM.   Kylie Tate, RD, LDN Date: 05/12/12 Time: 2:00 PM

## 2012-05-12 NOTE — Telephone Encounter (Signed)
See above documentation for details

## 2012-06-03 ENCOUNTER — Telehealth (HOSPITAL_COMMUNITY): Payer: Self-pay | Admitting: Dietician

## 2012-06-03 NOTE — Telephone Encounter (Signed)
Confirmed appointment for 06/16/12 at 2:00 PM.

## 2012-06-04 ENCOUNTER — Telehealth (HOSPITAL_COMMUNITY): Payer: Self-pay | Admitting: Dietician

## 2012-06-04 NOTE — Telephone Encounter (Signed)
Mailed appointment confirmation letter and instructions for appointment scheduled 06/16/12 at 2:00 PM via [place] [employer].

## 2012-06-05 ENCOUNTER — Emergency Department (HOSPITAL_COMMUNITY)
Admission: EM | Admit: 2012-06-05 | Discharge: 2012-06-05 | Disposition: A | Discharge status: Home / Self Care | Payer: Medicare Other | Attending: Emergency Medicine | Admitting: Emergency Medicine

## 2012-06-05 ENCOUNTER — Encounter (HOSPITAL_COMMUNITY): Payer: Self-pay | Admitting: *Deleted

## 2012-06-05 DIAGNOSIS — Z7982 Long term (current) use of aspirin: Secondary | ICD-10-CM | POA: Insufficient documentation

## 2012-06-05 DIAGNOSIS — I10 Essential (primary) hypertension: Secondary | ICD-10-CM | POA: Insufficient documentation

## 2012-06-05 DIAGNOSIS — N390 Urinary tract infection, site not specified: Secondary | ICD-10-CM

## 2012-06-05 DIAGNOSIS — R739 Hyperglycemia, unspecified: Secondary | ICD-10-CM

## 2012-06-05 DIAGNOSIS — Z79899 Other long term (current) drug therapy: Secondary | ICD-10-CM | POA: Insufficient documentation

## 2012-06-05 DIAGNOSIS — K219 Gastro-esophageal reflux disease without esophagitis: Secondary | ICD-10-CM | POA: Insufficient documentation

## 2012-06-05 DIAGNOSIS — E1169 Type 2 diabetes mellitus with other specified complication: Secondary | ICD-10-CM | POA: Insufficient documentation

## 2012-06-05 LAB — BASIC METABOLIC PANEL
BUN: 30 mg/dL — ABNORMAL HIGH (ref 6–23)
Calcium: 9.8 mg/dL (ref 8.4–10.5)
GFR calc non Af Amer: 42 mL/min — ABNORMAL LOW (ref >90)
Glucose, Bld: 541 mg/dL — ABNORMAL HIGH (ref 70–99)

## 2012-06-05 LAB — GLUCOSE, CAPILLARY
Glucose-Capillary: 382 mg/dL — ABNORMAL HIGH (ref 70–99)
Glucose-Capillary: 300 mg/dL — ABNORMAL HIGH (ref 70–99)
Glucose-Capillary: 436 mg/dL — ABNORMAL HIGH (ref 70–99)

## 2012-06-05 LAB — BLOOD GAS, VENOUS
O2 Content: 2 L/min
Patient temperature: 37
TCO2: 21.5 mmol/L (ref 0–100)
pH, Ven: 7.359 — ABNORMAL HIGH (ref 7.250–7.300)

## 2012-06-05 LAB — URINALYSIS, ROUTINE W REFLEX MICROSCOPIC
Bilirubin Urine: NEGATIVE
Protein, ur: NEGATIVE mg/dL
Urobilinogen, UA: 0.2 mg/dL (ref 0.0–1.0)

## 2012-06-05 LAB — URINE MICROSCOPIC-ADD ON

## 2012-06-05 MED ORDER — DEXTROSE-NACL 5-0.45 % IV SOLN: INTRAVENOUS | Status: DC

## 2012-06-05 MED ORDER — DIAZEPAM 5 MG PO TABS
5.0000 mg | ORAL_TABLET | Freq: Once | ORAL | Status: AC
  Administered 2012-06-05: 5 mg via ORAL
  Filled 2012-06-05: qty 1

## 2012-06-05 MED ORDER — SODIUM CHLORIDE 0.9 % IV SOLN
1000.0000 mL | Freq: Once | INTRAVENOUS | Status: AC
  Administered 2012-06-05: 1000 mL via INTRAVENOUS

## 2012-06-05 MED ORDER — SODIUM CHLORIDE 0.9 % IV SOLN
INTRAVENOUS | Status: DC
  Administered 2012-06-05: 5.4 [IU]/h via INTRAVENOUS
  Filled 2012-06-05: qty 1

## 2012-06-05 MED ORDER — CEFTRIAXONE SODIUM 1 G IJ SOLR
1.0000 g | Freq: Once | INTRAMUSCULAR | Status: AC
  Administered 2012-06-05: 1 g via INTRAMUSCULAR
  Filled 2012-06-05: qty 10

## 2012-06-05 MED ORDER — CEPHALEXIN 250 MG PO CAPS: 250.0000 mg | ORAL_CAPSULE | Freq: Four times a day (QID) | ORAL | Status: AC

## 2012-06-05 MED ORDER — SODIUM CHLORIDE 0.9 % IV SOLN: 1000.0000 mL | INTRAVENOUS | Status: DC

## 2012-06-05 NOTE — ED Provider Notes (Signed)
History   This chart was scribed for [name], MD by Shari Heritage. The patient was seen in room APA07/APA07. Patient's care was started at 1502.     CSN: [id number]  Arrival date & time 06/05/12  1502   First MD Initiated Contact with Patient 06/05/12 1509      Chief Complaint  Patient presents with  . Hyperglycemia    (Consider location/radiation/quality/duration/timing/severity/associated sxs/prior treatment) The history is provided by the patient. No language interpreter was used.   Kylie Tate is a 71 y.o. female who presents to the Emergency Department complaining of intermittent hyperglycemic episodes onset 2 days ago. Patient states that today, her blood sugar reached more than 600 today. Associated symptoms include dizziness and weakness. Patient takes NovoLog and Levemir to manage her blood sugar levels daily at breakfast, lunch and dinner. Patient currently takes 55 mL of Levemir and 15 mL of NovoLog at breakfast;15 mL of NovoLog at lunch; and 55 mL of Levemir and 15 mL of Levemir at dinner. Patient states that she has been having changes in vision for a few weeks, but reports no specifics. Patient denies SOB, cough, chest pain, nausea, vomiting, urinary problems, bowel problems, leg pain, arm pain, numbness or tingling. Patient's glucose levels have been running higher than usual for almost a week with sporadic highs according to patient's own measurements and recordings. Patient states that she has been watching what she eats. Patient with h/o diabetes, HTN, GERD, hyperlipidemia and diverticulitis. Surgical h/o stomach surgery and hernia repair. Patient has never smoked.  PCP - Ouida Sills    Past Medical History  Diagnosis Date  . Diabetes mellitus   . Hypertension   . GERD (gastroesophageal reflux disease)   . Hyperlipidemia   . Diverticulitis     Past Surgical History  Procedure Date  . Stomach surgery   . Hernia repair     Family History  Problem Relation  Age of Onset  . Heart disease    . Arthritis    . Cancer    . Diabetes      History  Substance Use Topics  . Smoking status: Never Smoker   . Smokeless tobacco: Not on file  . Alcohol Use: No    OB History    Grav Para Term Preterm Abortions TAB SAB Ect Mult Living                  Review of Systems  Respiratory: Negative for cough and shortness of breath.   Gastrointestinal: Negative for nausea, vomiting, constipation and blood in stool.  Genitourinary: Negative for dysuria, urgency, frequency, decreased urine volume and difficulty urinating.  Neurological: Positive for weakness. Negative for numbness.  All other systems reviewed and are negative.    Allergies  Review of patient's allergies indicates no known allergies.  Home Medications   Current Outpatient Rx  Name Route Sig Dispense Refill  . ATORVASTATIN CALCIUM 10 MG PO TABS Oral Take 10 mg by mouth daily. For CHOLESTEROL    . DIAZEPAM 5 MG PO TABS Oral Take 5 mg by mouth 3 (three) times daily as needed. NERVES or AS DIRECTED BY PHYSICIAN    . IMIPRAMINE HCL 10 MG PO TABS Oral Take 30 mg by mouth at bedtime. FOR SLEEP/MOOD or AS DIRECTED BY PHYSICIAN    . INSULIN ASPART 100 UNIT/ML [place] SOLN Subcutaneous Inject 15 Units into the skin 3 (three) times daily before meals.     . INSULIN DETEMIR 100 UNIT/ML [place] SOLN  Subcutaneous Inject 55 Units into the skin 2 (two) times daily.     Marland Kitchen LEVOTHYROXINE SODIUM 50 MCG PO TABS Oral Take 50 mcg by mouth daily. For THYROID    . PANCRELIPASE (LIP-PROT-AMYL) 12000 UNITS PO CPEP Oral Take 2 capsules by mouth 3 (three) times daily before meals. FOR DIGESTION or AS DIRECTED BY PHYSICIAN    . LOSARTAN POTASSIUM 50 MG PO TABS Oral Take 50 mg by mouth daily. For BLOOD PRESSURE    . METOCLOPRAMIDE HCL 10 MG PO TABS Oral Take 10 mg by mouth 4 (four) times daily. For GERD/NAUSEA    . CENTRUM PO Oral Take by mouth.    . NABUMETONE 500 MG PO TABS Oral Take 500 mg by mouth 2 (two) times daily.  For ANTI-INFLAMMATORY    . TRAZODONE HCL 50 MG PO TABS Oral Take 50 mg by mouth at bedtime. For SLEEP    . CEPHALEXIN 250 MG PO CAPS Oral Take 1 capsule (250 mg total) by mouth 4 (four) times daily. 28 capsule 0    BP 138/69  Pulse 68  Temp 98.2 F (36.8 C) (Oral)  Resp 20  Ht 5\' 4"  (1.626 m)  Wt 156 lb (70.761 kg)  BMI 26.78 kg/m2  SpO2 97%  Physical Exam  Nursing note and vitals reviewed. Constitutional: She is oriented to person, place, and time. She appears well-developed and well-nourished. No distress.  HENT:  Head: Normocephalic and atraumatic.  Eyes: Conjunctivae and EOM are normal.  Neck: Neck supple. No tracheal deviation present.  Cardiovascular: Normal rate and regular rhythm.   No murmur heard. Pulmonary/Chest: Effort normal and breath sounds normal. No respiratory distress. She has no wheezes. She has no rales.  Abdominal: Soft. Bowel sounds are normal. She exhibits no distension. There is no tenderness. There is no CVA tenderness.  Genitourinary: No breast tenderness.  Musculoskeletal: Normal range of motion. She exhibits no edema.       Cervical back: She exhibits no tenderness.       Thoracic back: She exhibits no tenderness.       Lumbar back: She exhibits no tenderness.  Neurological: She is alert and oriented to person, place, and time. No cranial nerve deficit or sensory deficit. Coordination normal.  Skin: Skin is dry.  Psychiatric: She has a normal mood and affect. Her behavior is normal.    ED Course  Procedures (including critical care time) DIAGNOSTIC STUDIES: Oxygen Saturation is 99% on room air, normal by my interpretation.    COORDINATION OF CARE: 3:50PM- Patient informed of current plan for treatment and evaluation and agrees with plan at this time. Patient's physical exam was unremarkable. Patient's states she is hungry. Will provide patient with food.  6:20PM- Updated patient on lab work which suggested that patient has a UTI. Patient may  need to stay overnight for treatment.  9:24PM- Patient ate a normal carbohydrate dinner which caused elevated glucose levels while in the ED. Patient's glucose level at this time is 284. Will ask patient to take evening dose of Levemir now. Will d/c insulin drip in 30 minutes and discharge home.  10:35PM- Updated patient on treatment plan. Recommended that patient start antibiotic course tomorrow because UTI is likely causing elevated sugar levels. Last glucose level measurement is 142. Will discharge patient with antibiotic prescription.     Date: 06/05/2012  Rate: 74  Rhythm: normal sinus rhythm  QRS Axis: normal  Intervals: normal  ST/T Wave abnormalities: normal  Conduction Disutrbances:none  Narrative Interpretation:  Old EKG Reviewed: none available   Results for orders placed during the hospital encounter of 06/05/12  BASIC METABOLIC PANEL      Component Value Range   Sodium 130 (*) 135 - 145 mEq/L   Potassium 4.3  3.5 - 5.1 mEq/L   Chloride 95 (*) 96 - 112 mEq/L   CO2 23  19 - 32 mEq/L   Glucose, Bld 541 (*) 70 - 99 mg/dL   BUN 30 (*) 6 - 23 mg/dL   Creatinine, Ser 1.19 (*) 0.50 - 1.10 mg/dL   Calcium 9.8  8.4 - 14.7 mg/dL   GFR calc non Af Amer 42 (*) >90 mL/min   GFR calc Af Amer 48 (*) >90 mL/min  URINALYSIS, ROUTINE W REFLEX MICROSCOPIC      Component Value Range   Color, Urine STRAW (*) YELLOW   APPearance CLEAR  CLEAR   Specific Gravity, Urine <1.005 (*) 1.005 - 1.030   pH 6.0  5.0 - 8.0   Glucose, UA >1000 (*) NEGATIVE mg/dL   Hgb urine dipstick TRACE (*) NEGATIVE   Bilirubin Urine NEGATIVE  NEGATIVE   Ketones, ur NEGATIVE  NEGATIVE mg/dL   Protein, ur NEGATIVE  NEGATIVE mg/dL   Urobilinogen, UA 0.2  0.0 - 1.0 mg/dL   Nitrite NEGATIVE  NEGATIVE   Leukocytes, UA NEGATIVE  NEGATIVE  BLOOD GAS, VENOUS      Component Value Range   O2 Content 2.0     pH, Ven 7.359 (*) 7.250 - 7.300   pCO2, Ven 42.7 (*) 45.0 - 50.0 mmHg   pO2, Ven 118.0 (*) 30.0 - 45.0 mmHg     Bicarbonate 23.4  20.0 - 24.0 mEq/L   TCO2 21.5  0 - 100 mmol/L   Acid-base deficit 1.2  0.0 - 2.0 mmol/L   O2 Saturation 97.1     Patient temperature 37.0     Collection site VEIN     Sample type VEIN    URINE MICROSCOPIC-ADD ON      Component Value Range   Squamous Epithelial / LPF FEW (*) RARE   WBC, UA 21-50  <3 WBC/hpf   RBC / HPF 0-2  <3 RBC/hpf   Bacteria, UA MANY (*) RARE   Urine-Other YEAST    GLUCOSE, CAPILLARY      Component Value Range   Glucose-Capillary 382 (*) 70 - 99 mg/dL  GLUCOSE, CAPILLARY      Component Value Range   Glucose-Capillary 384 (*) 70 - 99 mg/dL   Comment 1 Notify RN    GLUCOSE, CAPILLARY      Component Value Range   Glucose-Capillary 300 (*) 70 - 99 mg/dL  GLUCOSE, CAPILLARY      Component Value Range   Glucose-Capillary 436 (*) 70 - 99 mg/dL  GLUCOSE, CAPILLARY      Component Value Range   Glucose-Capillary 284 (*) 70 - 99 mg/dL  GLUCOSE, CAPILLARY      Component Value Range   Glucose-Capillary 142 (*) 70 - 99 mg/dL    No results found.   1. Hyperglycemia   2. UTI (lower urinary tract infection)       MDM  Hyperglycemia, likely secondary to urinary tract infection. She is treated and improved in emergency department. Doubt metabolic instability, serious bacterial infection or impending vascular collapse; the patient is stable for discharge.   Plan: Home Medications- keflex; Home Treatments- fluids; Recommended follow up- PCP 1 week and prn   I personally performed the services described in this  documentation, which was scribed in my presence. The recorded information has been reviewed and considered.     [name], MD 06/05/12 2351

## 2012-06-05 NOTE — ED Notes (Signed)
Pt states she takes valium 5 mg three times a day for vertigo. States she has to have it or she will end up in the hospital. edp aware

## 2012-06-05 NOTE — ED Notes (Addendum)
2140- Per Dr. Effie Shy patient took Levimir 55units of home insulin.

## 2012-06-05 NOTE — ED Notes (Signed)
Venous abg not done

## 2012-06-05 NOTE — ED Notes (Signed)
Patient walked to the restroom and back to room with no assistance. Family at bedside.

## 2012-06-05 NOTE — ED Notes (Signed)
Blood sugar over 600 today. Pt alert, cooperative.  Feels weak.

## 2012-06-09 LAB — URINE CULTURE: Colony Count: >=100000

## 2012-06-12 ENCOUNTER — Other Ambulatory Visit: Payer: Self-pay | Admitting: Orthopedic Surgery

## 2012-06-12 DIAGNOSIS — M2550 Pain in unspecified joint: Secondary | ICD-10-CM

## 2012-06-16 ENCOUNTER — Encounter (HOSPITAL_COMMUNITY): Payer: Self-pay | Admitting: Dietician

## 2012-06-16 NOTE — Progress Notes (Signed)
Follow-Up Outpatient Nutrition Note Date: 06/16/12 Time: 2:00 PM  Nutrition Assessment:  Current weight: Weight: 159 lb (72.122 kg)  BMI: Body mass index is 26.46 kg/(m^2).  Weight changes: -0# (0%) x 1 month  Kylie Tate has maintained her weight. She is pleased with her progress. She reports continued struggle with glycemic control up until recently. She reports that she has in the ER about 2 weeks ago due to hyperglycemia (reports levels above 600). She attributes this to a bladder infection, which is now resolved. She reports that she is now on Insulin 3 times a day and this, along with the resolution of her bladder infection, has improved for glycemic control "for now". She reports CBGs in the 67-120 range. She reports that she eats a snack of peanut butter and crackers for HS snack, if her HS blood sugar runs lower than 80.  She continues to use the plate method to refer for dietary guidance.  She reports that she is now exercising more. In addition to water aerobics 3 times daily, she is swimming for 1/2 hour after water aerobics as well as starting to use the exercise equipment at the gym. She reports that when she checks her blood sugar after swimming, her readings range from 110-112. After checking her blood sugar and taking Insulin, she goes and uses the exercise equipment.  Pt has multiple diet related questions, including "Is it ok to eat peanut butter?", "Is there any type of fried food that I can eat?", "Can I eat green peas?", and "Can I eat fish as long as it's broiled?"  Labs: No new labs  Diet recall: Breakfast: 1/2 banana and bowl of oatmeal; Lunch:1/2 tomato sandwich and 3 crackers; Dinner: 1/2 tomato sandwich; HS snack (if CBG below 80): peanut butter crackers. Pt reports the only beverage she drinks is water.  Nutrition Diagnosis: Inconsistent carbohydrate intake continues  Nutrition Intervention: Nutrition rx: 1500 kcal NAS, diabetic diet; 3 meals per day, 1-2 snacks daily;  limit 1 starch per meal; low calorie beverages only; 2.5 hours physical activity weekly  Education/ counseling provided: Reviewed plate method. Reviewed sources of carbohydrate and encouraged pt to to limit to 1 starch per meal. Discussed importance of checking blood sugar and eating a small snack between workouts. Encouraged continued physical activity to assist with obtaining optimal glycemic control and achieving weight loss goals. Discussed importance of limiting fried foods and to choose baked, boiled, broiled, or grilled proteins more often. Also discussed different options for low calorie beverages, including low calorie drink mixes and water with a lemon slice.   Understanding/Motivation/ Ability to follow recommendations: Expect fair to good compliance.   Monitoring and Evaluation: Previous goals: 1) 1-2# weight loss per week- progressing; 3) 30 minutes physical activity 5 times per week- progressing  Goals for next visit: 1) 1-2# weight loss per week; 3) 2.5 hours physical activity per week    Recommendations: 1) Choose diet or low calorie beverages or water (with optional fruit slice); 2) Use measuring cups to ensure proper portions of foods; 3) Continue with exercise regimen; 4) Eat snack (ex. Piece of fruit of peanut butter crackers) after workout to prevent hypoglycemia between workouts  F/U:  4-6 weeks. Follow-up appointment scheduled for 07/14/12 at 2:00 PM.   Melody Haver, RD, LDN Date: 06/16/12 Time: 2:00 PM

## 2012-07-07 ENCOUNTER — Other Ambulatory Visit: Payer: Self-pay | Admitting: Neurology

## 2012-07-07 DIAGNOSIS — R200 Anesthesia of skin: Secondary | ICD-10-CM

## 2012-07-09 ENCOUNTER — Ambulatory Visit (HOSPITAL_COMMUNITY)
Admission: RE | Admit: 2012-07-09 | Discharge: 2012-07-09 | Disposition: A | Discharge status: Home / Self Care | Payer: Medicare Other | Source: Ambulatory Visit | Attending: Neurology | Admitting: Neurology

## 2012-07-09 ENCOUNTER — Telehealth (HOSPITAL_COMMUNITY): Payer: Self-pay | Admitting: Dietician

## 2012-07-09 DIAGNOSIS — E119 Type 2 diabetes mellitus without complications: Secondary | ICD-10-CM | POA: Insufficient documentation

## 2012-07-09 DIAGNOSIS — E785 Hyperlipidemia, unspecified: Secondary | ICD-10-CM | POA: Insufficient documentation

## 2012-07-09 DIAGNOSIS — R209 Unspecified disturbances of skin sensation: Secondary | ICD-10-CM | POA: Insufficient documentation

## 2012-07-09 DIAGNOSIS — I10 Essential (primary) hypertension: Secondary | ICD-10-CM | POA: Insufficient documentation

## 2012-07-09 DIAGNOSIS — R200 Anesthesia of skin: Secondary | ICD-10-CM

## 2012-07-09 NOTE — Telephone Encounter (Signed)
Mailed appointment confirmation letter and instructions for appointment scheduled 07/14/12 at 2:00 PM via [place] [employer].

## 2012-07-13 ENCOUNTER — Telehealth (HOSPITAL_COMMUNITY): Payer: Self-pay | Admitting: Dietician

## 2012-07-13 NOTE — Telephone Encounter (Signed)
Pt desires rescheduling appointment, due to eye doctor appointment on same day. Appointment rescheduled for 07/21/12 at 2:00 PM.

## 2012-07-16 ENCOUNTER — Telehealth (HOSPITAL_COMMUNITY): Payer: Self-pay | Admitting: Dietician

## 2012-07-16 NOTE — Telephone Encounter (Signed)
Mailed appointment confirmation letter and instructions for appointment scheduled 07/21/12 at 2:00 PM via [place] [employer].

## 2012-07-21 ENCOUNTER — Encounter (HOSPITAL_COMMUNITY): Payer: Self-pay | Admitting: Dietician

## 2012-07-21 NOTE — Progress Notes (Signed)
Follow-Up Outpatient Nutrition Note Date: 07/21/2012 Appt Start Time: 1:57 PM  Nutrition Assessment:  Current weight: Weight: 161 lb (73.029 kg)  BMI: Body mass index is 26.79 kg/(m^2).  Weight changes: +2# (1.1%) x 1 month  Kylie Tate reports "knew I gained weight. I ate a lot of food at my family reunion last weekend. It was so good". She reports she ate small portions of [employer], meatloaf, green beans, and cabbage that day. CBGs remain problematic at night. She reports levels between 122-132 in the AM (fasting) and between 250-300's at night. She reports that she is afraid to eat in fear that her blood sugar will go to high. She says "I eat in accordance of my blood sugar".  She continues to exercise. She reports that she is doing water aerobics 2 times a week and exercise on the machines 3 times a week. She reports that she was doing both daily, but she has refrained from doing that two weeks ago, per the advice of the [employer] workers. She reports that the pool at the [facility] will be closed for 3 weeks, but she is looking forward to taking her pool classes in [place] by carpooling with her classmates.   Labs: No new labs  Diet recall: Breakfast: 1/2 banana and bowl of oatmeal; Snack: 3 peanut butter crackers and salad Lunch:salad and fish; Snack: 4 peanut butter crackers Dinner: skip; HS snack (if CBG below 80): peanut butter crackers OR vegetables chips. Pt reports the only beverage she drinks is water.  Nutrition Diagnosis: Inconsistent carbohydrate intake continues  Nutrition Intervention: Nutrition rx: 1500 kcal NAS, diabetic diet; 3 meals per day, 1-2 snacks daily; limit 1 starch per meal; low calorie beverages only; 2.5 hours physical activity weekly  Education/ counseling provided: Reviewed plate method. Reviewed sources of carbohydrate and encouraged pt to to limit to 1 starch per meal. Had a long discussion with pt that eliminating carbohydrate sources is not the answer to controlling blood sugar.  Reviewed portion sizes and encouraged choosing high fiber carbohydrates most often. Encouraged continued physical activity to assist with obtaining optimal glycemic control and achieving weight loss goals.   Understanding/Motivation/ Ability to follow recommendations: Expect fair to good compliance.   Monitoring and Evaluation: Previous goals: 1) 1-2# weight loss per week- goal not met; 3) 2.5 hours physical activity per week- goal met  Goals for next visit: 1) 1-2# weight loss per week; 3) 2.5 hours physical activity per week; 3) Hgb A1C , 7.0    Recommendations: 1) Use measuring cups to ensure proper portions of foods; 2) Continue with exercise regimen; 3) Include 1 starch with every meal  F/U:  4-6 weeks. Follow-up appointment scheduled for 08/18/12 at 2:00 PM.   Kylie Tate, RD, LDN Date: 07/21/2012 Appt End Time: 2:45 PM

## 2012-08-18 ENCOUNTER — Encounter (HOSPITAL_COMMUNITY): Payer: Self-pay | Admitting: Dietician

## 2012-08-18 NOTE — Progress Notes (Addendum)
Follow-Up Outpatient Nutrition Note Date: 08/18/12 Appt Start Time: 1357  Nutrition Assessment:  Current weight: Weight: 160 lb (72.576 kg)  BMI: Body mass index is 26.63 kg/(m^2).  Weight changes: -1# (0.6%) x 1 month  Kylie Tate reports that she has "been eating too much". She is surprised by the weight loss and complains about a big stomach; "I got to slack off on eating". She continues to go to water aerobics 3 times a week at the [facility].  She reports that her new PCP is Dr. Jeanice Lim and she will be seeing her for the first time next week. She is concerned that she does not know where her office is.  She reports much better glycemic control; CBGs now in the 100's. She recently started seeing Dr. Fransico Him (endocrinologist). She reports that she has been taking diabetes education classes in his office. She reports the class covered how to administer insulin, self-management training, and writing CBGs down in a log book. She has refrained from drinking anything but water, as the education materials she was provided at the endocrinologist's office encouraged her to do so.  Labs: No new labs  Diet recall: Breakfast: bowl of cheerios; Snack: 3 peanut butter crackers; Lunch: baked chicken sandwich and onion rings; Snack: 4 peanut butter crackers Dinner: starch, vegetable, meat. Pt reports the only beverage she drinks is water.  Nutrition Diagnosis: Inconsistent carbohydrate intake continues  Nutrition Intervention: Nutrition rx: 1500 kcal NAS, diabetic diet; 3 meals per day, 1-2 snacks daily; limit 1 starch per meal; low calorie beverages only; 2.5 hours physical activity weekly  Education/ counseling provided: Reviewed diabetic diet principles. Encouraged pt to continue exercising and eating small portions of carbohydrate containing foods. Reinforced importance of taking blood sugar and medications the ways her endocrinologist prescribed. Also provided a map and written instructions to Dr. Deirdre Peer office,  per pt request.   Understanding/Motivation/ Ability to follow recommendations: Expect fair to good compliance.   Monitoring and Evaluation: Previous goals: 1) 1-2# weight loss per week- goal not met; 3) 2.5 hours physical activity per week- goal met; 3) Hgb A1C > 7.0 - unable to assess  Goals for next visit: 1) 1-2# weight loss per week; 3) 2.5 hours physical activity per week; 3) Hgb A1C > 7.0    Recommendations: 1) Use measuring cups to ensure proper portions of foods; 2) Continue with exercise regimen; 3) Include 1 starch with every meal  F/U: No future scheduled visit at this time. Pt informed me that this will be her last visit, as she is taking diabetes education classes in Dr. Isidoro Donning office. Will keep referral on file.   Melody Haver, RD, LDN Date: 08/18/12 Appt End Time: 1500

## 2012-08-25 ENCOUNTER — Encounter: Payer: Self-pay | Admitting: Family Medicine

## 2012-08-25 ENCOUNTER — Ambulatory Visit (INDEPENDENT_AMBULATORY_CARE_PROVIDER_SITE_OTHER): Payer: Medicare Other | Admitting: Family Medicine

## 2012-08-25 ENCOUNTER — Other Ambulatory Visit: Payer: Self-pay | Admitting: Family Medicine

## 2012-08-25 VITALS — BP 136/64 | HR 75 | Resp 18 | Ht 64.0 in | Wt 162.1 lb

## 2012-08-25 DIAGNOSIS — Z139 Encounter for screening, unspecified: Secondary | ICD-10-CM

## 2012-08-25 DIAGNOSIS — E119 Type 2 diabetes mellitus without complications: Secondary | ICD-10-CM

## 2012-08-25 DIAGNOSIS — I10 Essential (primary) hypertension: Secondary | ICD-10-CM

## 2012-08-25 DIAGNOSIS — M1990 Unspecified osteoarthritis, unspecified site: Secondary | ICD-10-CM

## 2012-08-25 DIAGNOSIS — E042 Nontoxic multinodular goiter: Secondary | ICD-10-CM

## 2012-08-25 DIAGNOSIS — E785 Hyperlipidemia, unspecified: Secondary | ICD-10-CM

## 2012-08-25 NOTE — Progress Notes (Signed)
  Subjective:    Patient ID: Kylie Tate, female    DOB: 02-Jan-1941, 71 y.o.   MRN: [id number]  HPI Patient here to establish care. Previous PCP Dr. Ouida Sills. Endocrinologist Dr. Fransico Him Neurology Dr. Gerilyn Pilgrim Diabetes mellitus for 34 years, doing well with endocrinologist, she follows with nutritionist and is active at the [facility].  She is on many medications and does not know why. She empty anti-inflammatory meds other bottles.  She is imipramine and trazodone for sleep, denies urinary problems. She is on valium and neurontin but not sure why. Has history of diverticulitis and digestive issues, has been on creon and reglan for many years  Review of Systems  GEN- denies fatigue, fever, weight loss,weakness, recent illness HEENT- denies eye drainage, change in vision, nasal discharge, CVS- denies chest pain, palpitations RESP- denies SOB, cough, wheeze ABD- denies N/V, change in stools, abd pain GU- denies dysuria, hematuria, dribbling, incontinence MSK- denies joint pain, muscle aches, injury Neuro- denies headache, dizziness, syncope, seizure activity      Objective:   Physical Exam GEN- NAD, alert and oriented x3 HEENT- PERRL, EOMI, non injected sclera, pink conjunctiva, MMM, oropharynx clear Neck- Supple, no thryomegaly CVS- RRR, 1/6 Systolic murmur RESP-CTAB ABS-NABS,soft,NT,ND EXT- No edema Pulses- Radial, DP- 2+ Psych- Normal affect and Mood      Assessment & Plan:

## 2012-08-25 NOTE — Patient Instructions (Addendum)
Stop the nabumetone  I will get the records  Continue all other medications F/U 4 weeks

## 2012-08-26 ENCOUNTER — Encounter: Payer: Self-pay | Admitting: Family Medicine

## 2012-08-26 DIAGNOSIS — E042 Nontoxic multinodular goiter: Secondary | ICD-10-CM | POA: Insufficient documentation

## 2012-08-26 DIAGNOSIS — E119 Type 2 diabetes mellitus without complications: Secondary | ICD-10-CM | POA: Insufficient documentation

## 2012-08-26 DIAGNOSIS — I10 Essential (primary) hypertension: Secondary | ICD-10-CM | POA: Insufficient documentation

## 2012-08-26 DIAGNOSIS — M1990 Unspecified osteoarthritis, unspecified site: Secondary | ICD-10-CM | POA: Insufficient documentation

## 2012-08-26 DIAGNOSIS — E785 Hyperlipidemia, unspecified: Secondary | ICD-10-CM | POA: Insufficient documentation

## 2012-08-26 NOTE — Assessment & Plan Note (Signed)
On statin therapy 

## 2012-08-26 NOTE — Assessment & Plan Note (Addendum)
Obtain labs from endocrine for last A1C, on statin and ARB

## 2012-08-26 NOTE — Assessment & Plan Note (Addendum)
BP looks good today, no change to meds today

## 2012-08-26 NOTE — Assessment & Plan Note (Signed)
Has seen ortho in the past, no longer on NSAIDS, denies any pain, exercises including water aerobics, will use acetaminophen as needed

## 2012-08-26 NOTE — Assessment & Plan Note (Signed)
Followed by endocrine, records received will review, pt does not recall thyroid biopsy done in 2011

## 2012-09-08 ENCOUNTER — Telehealth: Payer: Self-pay | Admitting: Internal Medicine

## 2012-09-08 NOTE — Telephone Encounter (Signed)
I think this is okay, she can use a topical powder OTC if just sweating, if not we can discuss at appt as she would need to be evaluated

## 2012-09-08 NOTE — Telephone Encounter (Signed)
Patient states that she is having night sweats only around her waist area x 4 days.  Please advise.

## 2012-09-09 ENCOUNTER — Other Ambulatory Visit: Payer: Self-pay

## 2012-09-09 MED ORDER — DILTIAZEM HCL ER COATED BEADS 180 MG PO CP24: 180.0000 mg | ORAL_CAPSULE | Freq: Every day | ORAL | Status: DC

## 2012-09-09 MED ORDER — LEVOTHYROXINE SODIUM 50 MCG PO TABS: 50.0000 ug | ORAL_TABLET | Freq: Every day | ORAL | Status: DC

## 2012-09-14 ENCOUNTER — Encounter: Payer: Self-pay | Admitting: Family Medicine

## 2012-09-15 ENCOUNTER — Other Ambulatory Visit: Payer: Self-pay | Admitting: Orthopedic Surgery

## 2012-09-17 NOTE — Telephone Encounter (Signed)
Patient aware.

## 2012-09-22 ENCOUNTER — Encounter: Payer: Self-pay | Admitting: Family Medicine

## 2012-09-22 ENCOUNTER — Ambulatory Visit (INDEPENDENT_AMBULATORY_CARE_PROVIDER_SITE_OTHER): Payer: Medicare Other | Admitting: Family Medicine

## 2012-09-22 VITALS — BP 136/66 | HR 72 | Resp 18 | Ht 64.0 in | Wt 162.0 lb

## 2012-09-22 DIAGNOSIS — I10 Essential (primary) hypertension: Secondary | ICD-10-CM

## 2012-09-22 DIAGNOSIS — E119 Type 2 diabetes mellitus without complications: Secondary | ICD-10-CM

## 2012-09-22 DIAGNOSIS — F329 Major depressive disorder, single episode, unspecified: Secondary | ICD-10-CM

## 2012-09-22 DIAGNOSIS — K861 Other chronic pancreatitis: Secondary | ICD-10-CM | POA: Insufficient documentation

## 2012-09-22 DIAGNOSIS — F32A Depression, unspecified: Secondary | ICD-10-CM

## 2012-09-22 DIAGNOSIS — G629 Polyneuropathy, unspecified: Secondary | ICD-10-CM | POA: Insufficient documentation

## 2012-09-22 DIAGNOSIS — F3289 Other specified depressive episodes: Secondary | ICD-10-CM

## 2012-09-22 DIAGNOSIS — G589 Mononeuropathy, unspecified: Secondary | ICD-10-CM

## 2012-09-22 MED ORDER — ATORVASTATIN CALCIUM 10 MG PO TABS: 10.0000 mg | ORAL_TABLET | Freq: Every day | ORAL | Status: DC

## 2012-09-22 NOTE — Progress Notes (Signed)
  Subjective:    Patient ID: Kylie Tate, female    DOB: 14-Dec-1940, 71 y.o.   MRN: [id number]  HPI  Patient here to followup interim visit. She was establish as a new patient for weeks ago. She had a lot of confusion regarding her medications and her records have not been reviewed. She's a history of chronic pancreatitis and diverticulosis. She was started on Reglan and Creon secondary to these disorders. She also has a history of dizzy spells and neuropathy therefore was started on gabapentin and Valium by neurology. Her diabetes mellitus is uncontrolled and she is being followed by endocrinology for this as well as multinodular goiter. She's currently on imipramine for her depression and low mood as well as trazodone to help her sleep however she does not have the trazodone for the past week and has not noticed any difference and would like to stay off of the medication if possible.  Review of Systems  GEN- denies fatigue, fever, weight loss,weakness, recent illness HEENT- denies eye drainage, change in vision, nasal discharge, CVS- denies chest pain, palpitations RESP- denies SOB, cough, wheeze ABD- denies N/V, change in stools, abd pain GU- denies dysuria, hematuria, dribbling, incontinence MSK- denies joint pain, muscle aches, injury Neuro- denies headache, dizziness, syncope, seizure activity      Objective:   Physical Exam  GEN- NAD, alert and oriented x3 CVS- RRR, 1/6 Systolic murmur RESP-CTAB EXT- No edema Pulses- Radial, DP- 2+ Psych- Normal affect and Mood       Assessment & Plan:

## 2012-09-22 NOTE — Assessment & Plan Note (Signed)
Continue enzymes and reglan,

## 2012-09-22 NOTE — Assessment & Plan Note (Signed)
Continue gabapentin.

## 2012-09-22 NOTE — Assessment & Plan Note (Signed)
No change to BP meds

## 2012-09-22 NOTE — Assessment & Plan Note (Signed)
Continue imipramine, will d/c trazodone and see how she does off the medication

## 2012-09-22 NOTE — Assessment & Plan Note (Signed)
Uncontrolled followed by endocrine, I will get a [facility] case worker for her, with her multiple meds and to help with compliance and confusion

## 2012-09-22 NOTE — Patient Instructions (Signed)
Continue current medications Stop the trazadone [facility] nurse to come to house F/U 3 months

## 2012-09-23 ENCOUNTER — Other Ambulatory Visit: Payer: Self-pay

## 2012-09-23 MED ORDER — METOCLOPRAMIDE HCL 10 MG PO TABS: 10.0000 mg | ORAL_TABLET | Freq: Every day | ORAL | Status: DC

## 2012-11-02 ENCOUNTER — Encounter: Payer: Self-pay | Admitting: Family Medicine

## 2012-11-02 ENCOUNTER — Ambulatory Visit (INDEPENDENT_AMBULATORY_CARE_PROVIDER_SITE_OTHER): Payer: Medicare Other | Admitting: Family Medicine

## 2012-11-02 VITALS — BP 120/76 | HR 80 | Resp 18 | Ht 64.0 in | Wt 164.1 lb

## 2012-11-02 DIAGNOSIS — R61 Generalized hyperhidrosis: Secondary | ICD-10-CM | POA: Insufficient documentation

## 2012-11-02 NOTE — Progress Notes (Signed)
  Subjective:    Patient ID: Kylie Tate, female    DOB: Apr 04, 1941, 71 y.o.   MRN: [id number]  HPI  Patient here to due to night sweats. She states that she gets sweaty right around her abdomen denies sweating in her face chest hair Moraxella this is been going on for the past couple of months. She denies feeling sick denies fever denies muscle aches or pains. He has been no change in her weight.  Review of Systems  GEN- denies fatigue, fever, weight loss,weakness, recent illness HEENT- denies eye drainage, change in vision, nasal discharge, CVS- denies chest pain, palpitations RESP- denies SOB, cough, wheeze ABD- denies N/V, change in stools, abd pain MSK- denies joint pain, muscle aches, injury Neuro- denies headache, dizziness, syncope, seizure activity      Objective:   Physical Exam  GEN- NAD, alert and oriented x3 HEENT-PERRL, EOMI, non icteric, MMM, orohparynx clear  CVS- RRR, 1/6 Systolic murmur RESP-CTAB EXT- No edema Pulses- Radial, DP- 2+ Skin- in tact, no rash        Assessment & Plan:

## 2012-11-02 NOTE — Patient Instructions (Signed)
Wear thin night gown  Continue current medications  Keep previous f/u appointment

## 2012-11-02 NOTE — Assessment & Plan Note (Signed)
Minimal symptoms with interesting distribution, no weight change, no B symptoms, no infectious signs/symptoms Given reassurance at this time

## 2012-11-16 ENCOUNTER — Encounter: Payer: Self-pay | Admitting: Family Medicine

## 2012-11-17 ENCOUNTER — Ambulatory Visit (HOSPITAL_COMMUNITY)
Admission: RE | Admit: 2012-11-17 | Discharge: 2012-11-17 | Disposition: A | Discharge status: Home / Self Care | Payer: Medicare Other | Source: Ambulatory Visit | Attending: Family Medicine | Admitting: Family Medicine

## 2012-11-17 DIAGNOSIS — Z139 Encounter for screening, unspecified: Secondary | ICD-10-CM

## 2012-11-17 DIAGNOSIS — Z1231 Encounter for screening mammogram for malignant neoplasm of breast: Secondary | ICD-10-CM | POA: Insufficient documentation

## 2012-11-18 DIAGNOSIS — F0390 Unspecified dementia without behavioral disturbance: Secondary | ICD-10-CM

## 2012-11-18 HISTORY — DX: Unspecified dementia, unspecified severity, without behavioral disturbance, psychotic disturbance, mood disturbance, and anxiety: F03.90

## 2012-12-01 ENCOUNTER — Encounter: Payer: Self-pay | Admitting: Family Medicine

## 2012-12-01 ENCOUNTER — Ambulatory Visit (INDEPENDENT_AMBULATORY_CARE_PROVIDER_SITE_OTHER): Payer: Medicare Other | Admitting: Family Medicine

## 2012-12-01 ENCOUNTER — Ambulatory Visit (HOSPITAL_COMMUNITY)
Admission: RE | Admit: 2012-12-01 | Discharge: 2012-12-01 | Disposition: A | Discharge status: Home / Self Care | Payer: Medicare Other | Source: Ambulatory Visit | Attending: Family Medicine | Admitting: Family Medicine

## 2012-12-01 VITALS — BP 124/68 | HR 82 | Resp 18 | Ht 64.0 in | Wt 166.0 lb

## 2012-12-01 DIAGNOSIS — E119 Type 2 diabetes mellitus without complications: Secondary | ICD-10-CM

## 2012-12-01 DIAGNOSIS — M25519 Pain in unspecified shoulder: Secondary | ICD-10-CM

## 2012-12-01 DIAGNOSIS — R61 Generalized hyperhidrosis: Secondary | ICD-10-CM

## 2012-12-01 DIAGNOSIS — E785 Hyperlipidemia, unspecified: Secondary | ICD-10-CM

## 2012-12-01 NOTE — Patient Instructions (Addendum)
Eat a snack before bedtime Get the xray of your shoulder We will fax the labs over to [facility]  Keep swimming for your shoulder  F/U 3 months - Change follow-up appointment

## 2012-12-02 ENCOUNTER — Encounter: Payer: Self-pay | Admitting: Family Medicine

## 2012-12-02 DIAGNOSIS — M25519 Pain in unspecified shoulder: Secondary | ICD-10-CM | POA: Insufficient documentation

## 2012-12-02 NOTE — Progress Notes (Signed)
  Subjective:    Patient ID: Kylie Tate, female    DOB: 1941/02/19, 72 y.o.   MRN: [id number]  HPI  Pt here with right shoulder pain, feels a knot over right shoulder for the past month, no injury, only tender if she pushes on it, can use arm as normal , has been swimming which has helped shoulder  She still sweats around abdomen, sugars have been good, no hypoglycemia, taking all other meds as normal , no fever, no illness,no weight loss  Review of Systems  GEN- denies fatigue, fever, weight loss,weakness, recent illness HEENT- denies eye drainage, change in vision, nasal discharge, CVS- denies chest pain, palpitations RESP- denies SOB, cough, wheeze MSK- + joint pain, muscle aches, injury Neuro- denies headache, dizziness, syncope, seizure activity      Objective:   Physical Exam  GEN- NAD, alert and oriented x3 HEENT-PERRL, EOMI, non icteric, MMM, orohparynx clear  Neck- good ROM CVS- RRR, 1/6 Systolic murmur RESP-CTAB EXT- No edema Pulses- Radial, DP- 2+ MSK-  + swelling over right trochanter- mild TTP, bony rominence felt, non fluctuant, no erythema, rotator cuff in tact, biceps in tact, decreased ROM with back scratch compared to left       Assessment & Plan:

## 2012-12-02 NOTE — Assessment & Plan Note (Signed)
Given reassurance, I actually mailed her a letter regarding this after she sent in a paper with differentials of night sweats which she has been evaluated for, no signs of TB She will have labs drawn at end of month for thyroid, electrolytes, DM

## 2012-12-02 NOTE — Assessment & Plan Note (Signed)
Followed by endocrine, blood sugars have been stable, no hypoglycemia, advised to eat a snack before bedtime in case sugar is getting low at night

## 2012-12-02 NOTE — Assessment & Plan Note (Addendum)
She does not have daily pain only with palpation, her activities have not changed, xray shows DJD and bone spur at [facility] joint and osteophyte over trochanter which is what I am likley palpating For now tylenol as needed, will hold on ortho referral, ICE to area of mild swelling

## 2012-12-07 ENCOUNTER — Other Ambulatory Visit: Payer: Self-pay | Admitting: Family Medicine

## 2012-12-14 ENCOUNTER — Other Ambulatory Visit: Payer: Self-pay | Admitting: Family Medicine

## 2012-12-17 LAB — LIPID PANEL
LDL Cholesterol: 77 mg/dL (ref 0–99)
Triglycerides: 208 mg/dL — ABNORMAL HIGH (ref <150)
VLDL: 42 mg/dL — ABNORMAL HIGH (ref 0–40)

## 2012-12-17 LAB — COMPREHENSIVE METABOLIC PANEL
ALT: 15 U/L (ref 0–35)
AST: 17 U/L (ref 0–37)
Alkaline Phosphatase: 107 U/L (ref 39–117)
BUN: 21 mg/dL (ref 6–23)
Calcium: 9.5 mg/dL (ref 8.4–10.5)
Creat: 1.5 mg/dL — ABNORMAL HIGH (ref 0.50–1.10)
Total Bilirubin: 0.5 mg/dL (ref 0.3–1.2)

## 2012-12-17 LAB — CBC
HCT: 36.6 % (ref 36.0–46.0)
Hemoglobin: 12.1 g/dL (ref 12.0–15.0)
MCH: 29.9 pg (ref 26.0–34.0)
MCHC: 33.1 g/dL (ref 30.0–36.0)
MCV: 90.4 fL (ref 78.0–100.0)
RDW: 14 % (ref 11.5–15.5)

## 2012-12-24 ENCOUNTER — Ambulatory Visit: Payer: Medicare Other | Admitting: Family Medicine

## 2013-01-05 ENCOUNTER — Other Ambulatory Visit: Payer: Self-pay | Admitting: Family Medicine

## 2013-01-11 ENCOUNTER — Telehealth: Payer: Self-pay | Admitting: Family Medicine

## 2013-01-13 NOTE — Telephone Encounter (Signed)
Is there anything that can be done for this? 

## 2013-01-14 ENCOUNTER — Other Ambulatory Visit: Payer: Self-pay

## 2013-01-14 MED ORDER — ATORVASTATIN CALCIUM 10 MG PO TABS: 10.0000 mg | ORAL_TABLET | Freq: Every day | ORAL | Status: DC

## 2013-01-14 NOTE — Telephone Encounter (Signed)
The message is not clear, please call and triage this patient, if unclear schedule her for tomorrow

## 2013-01-27 ENCOUNTER — Other Ambulatory Visit: Payer: Self-pay

## 2013-01-27 MED ORDER — LOSARTAN POTASSIUM 50 MG PO TABS: 50.0000 mg | ORAL_TABLET | Freq: Every day | ORAL | Status: DC

## 2013-01-28 ENCOUNTER — Telehealth: Payer: Self-pay

## 2013-01-28 NOTE — Telephone Encounter (Signed)
It is a B complex vitamin that pt has to buy either OTC or on line, it is fine for her to take I just had MVI in her records

## 2013-01-28 NOTE — Telephone Encounter (Signed)
[facility] called and said that Kylie Tate wanted to know if she was supposed to still be taking Metanx. I don't see it on her medlist.

## 2013-01-29 NOTE — Telephone Encounter (Signed)
Alisa aware 

## 2013-02-04 ENCOUNTER — Other Ambulatory Visit: Payer: Self-pay | Admitting: Family Medicine

## 2013-02-09 ENCOUNTER — Other Ambulatory Visit: Payer: Self-pay | Admitting: Family Medicine

## 2013-02-12 NOTE — Telephone Encounter (Signed)
Patient never returned phone call to followup.

## 2013-02-16 ENCOUNTER — Other Ambulatory Visit: Payer: Self-pay | Admitting: Family Medicine

## 2013-02-26 ENCOUNTER — Ambulatory Visit (INDEPENDENT_AMBULATORY_CARE_PROVIDER_SITE_OTHER): Payer: Medicare Other | Admitting: Family Medicine

## 2013-02-26 ENCOUNTER — Encounter: Payer: Self-pay | Admitting: Family Medicine

## 2013-02-26 VITALS — BP 138/64 | HR 62 | Resp 18 | Ht 64.0 in | Wt 167.1 lb

## 2013-02-26 DIAGNOSIS — K219 Gastro-esophageal reflux disease without esophagitis: Secondary | ICD-10-CM

## 2013-02-26 DIAGNOSIS — K861 Other chronic pancreatitis: Secondary | ICD-10-CM

## 2013-02-26 MED ORDER — OMEPRAZOLE 20 MG PO CPDR: 20.0000 mg | DELAYED_RELEASE_CAPSULE | ORAL | Status: DC

## 2013-02-26 NOTE — Patient Instructions (Signed)
Stop the reglan Continue omeprazole in morning Zantac at bedtime for nausea and acid reflux Continue all other medications F/U Dr.NIDA F/U 3 months

## 2013-02-27 ENCOUNTER — Encounter: Payer: Self-pay | Admitting: Family Medicine

## 2013-02-27 NOTE — Assessment & Plan Note (Addendum)
Her hospital records show chronic pancreatitis, but she does not recall any details, she is on creon and reglan, unknown how many years.  Will have her see GI locally, she still has problems Reflux and occasional nausea For now I will stop the 1 reglan pill she takes at bedtime I can only see a Dr. Juanda Chance in chart from 2001 who may have seen in during a hospital visit

## 2013-02-27 NOTE — Assessment & Plan Note (Signed)
Improved with omeprazole, take PPI in AM, zantac in pm

## 2013-02-27 NOTE — Progress Notes (Signed)
  Subjective:    Patient ID: Kylie Tate, female    DOB: June 27, 1941, 72 y.o.   MRN: [id number]  HPI  Pt here to f/u GERD, night sweats are still a rim of sweat around abdomen when she sleeps, some times has hot flash, no change in symptoms.  Was not taking omeprazole, after zantac started at bedtime to help with symptoms throughout evening, she restarted this and GERD has now resolved. Medications reviewed.   Review of Systems  GEN- denies fatigue, fever, weight loss,weakness, recent illness HEENT- denies eye drainage, change in vision, nasal discharge, CVS- denies chest pain, palpitations RESP- denies SOB, cough, wheeze ABD- denies N/V, change in stools, abd pain Neuro- denies headache, dizziness, syncope, seizure activity      Objective:   Physical Exam  GEN- NAD, alert and oriented x3,weight stable HEENT-PERRL, EOMI, non icteric, MMM, orohparynx clear  Neck- supple, no LAD CVS- RRR, 1/6 Systolic murmur RESP-CTAB ABD-NABS,soft,ND,NT EXT- No edema Pulses- Radial 2+       Assessment & Plan:

## 2013-03-02 ENCOUNTER — Ambulatory Visit: Payer: Medicare Other | Admitting: Family Medicine

## 2013-03-02 ENCOUNTER — Ambulatory Visit (INDEPENDENT_AMBULATORY_CARE_PROVIDER_SITE_OTHER): Payer: Medicare Other | Admitting: Gastroenterology

## 2013-03-02 ENCOUNTER — Other Ambulatory Visit: Payer: Self-pay | Admitting: Internal Medicine

## 2013-03-02 ENCOUNTER — Encounter: Payer: Self-pay | Admitting: Internal Medicine

## 2013-03-02 VITALS — BP 146/70 | HR 70 | Temp 97.6°F | Ht 64.0 in | Wt 167.0 lb

## 2013-03-02 DIAGNOSIS — K861 Other chronic pancreatitis: Secondary | ICD-10-CM

## 2013-03-02 DIAGNOSIS — Z1211 Encounter for screening for malignant neoplasm of colon: Secondary | ICD-10-CM

## 2013-03-02 DIAGNOSIS — R1319 Other dysphagia: Secondary | ICD-10-CM

## 2013-03-02 DIAGNOSIS — R1013 Epigastric pain: Secondary | ICD-10-CM

## 2013-03-02 DIAGNOSIS — R1314 Dysphagia, pharyngoesophageal phase: Secondary | ICD-10-CM

## 2013-03-02 DIAGNOSIS — K219 Gastro-esophageal reflux disease without esophagitis: Secondary | ICD-10-CM

## 2013-03-02 DIAGNOSIS — R1310 Dysphagia, unspecified: Secondary | ICD-10-CM | POA: Insufficient documentation

## 2013-03-02 MED ORDER — PEG 3350-KCL-NA BICARB-NACL 420 G PO SOLR: 4000.0000 mL | ORAL | Status: DC

## 2013-03-02 NOTE — Progress Notes (Signed)
Primary Care Physician:  [name], MD  Primary Gastroenterologist:  Jonette Eva, MD   Chief Complaint  Patient presents with  . Pancreatitis  . Gastrophageal Reflux    HPI:  Kylie Tate is a 72 y.o. female here for further evaluation of GERD and h/o chronic pancreatitis. Patient feels like food won't go down when swallowed. Drinks water to make it pass. Then feels like food stops in epigastrium followed by severe cramping. Pain last for hours. Had been on Prilosec for years after Nissen fundoplication. Stopped recently when put on Zantac (which was added at bedtime). She misunderstood directions and did not realize she was supposed to be on both medication. GI symptoms started at this time. Pain started easing when restarted Prilosec. Still some dysphagia. She takes her Prilosec at noon. Creon one before each meal for years. BMs infrequent. She started a diet regimen for constipation which consists of oatmeal, prunes, applesauce and now she has BM every morning. No melena, brbpr. Some anorexia. No vomiting. Weight up 5 pounds since 08/2012.  I note on EPIC, the day after this OV, she requested refill of Relafen from Dr. Romeo Apple. This medication was not on her med list.  CTA Abd 09/2010: dense calcifications at origin of SMA and celiac. SMA patent. ?high-grade stenosis of celiac axis. Focal density of pancreatic head with may represent calcification or a residual stent.   CT Abd without CM 08/2005: Multiple pancreatic calcification. Similar findings on CT in 2001 as well. Heavy calcifications within celiac, SMA, IMA.  Current Outpatient Prescriptions  Medication Sig Dispense Refill  . aspirin 81 MG tablet Take 81 mg by mouth daily.      Marland Kitchen atorvastatin (LIPITOR) 10 MG tablet Take 1 tablet (10 mg total) by mouth daily. For CHOLESTEROL  30 tablet  2  . diazepam (VALIUM) 5 MG tablet Take 5 mg by mouth 3 (three) times daily as needed. NERVES or AS DIRECTED BY PHYSICIAN      . diltiazem  (CARDIZEM CD) 180 MG 24 hr capsule TAKE ONE CAPSULE BY MOUTH EVERY DAY  30 capsule  1  . gabapentin (NEURONTIN) 100 MG capsule Take 100 mg by mouth 2 (two) times daily.      Marland Kitchen imipramine (TOFRANIL) 10 MG tablet Take 30 mg by mouth at bedtime. FOR SLEEP/MOOD or AS DIRECTED BY PHYSICIAN      . insulin aspart (NOVOLOG FLEXPEN) 100 UNIT/ML injection Inject 15 Units into the skin 3 (three) times daily before meals.       . insulin detemir (LEVEMIR FLEXPEN) 100 UNIT/ML injection Inject 25 Units into the skin 2 (two) times daily.       Marland Kitchen levothyroxine (SYNTHROID, LEVOTHROID) 50 MCG tablet TAKE 1 TABLET (50 MCG TOTAL)  BY MOUTH DAILY. FOR THYROID  30 tablet  3  . lipase/protease/amylase (CREON) 12000 UNITS CPEP Take 2 capsules by mouth 3 (three) times daily before meals. FOR DIGESTION or AS DIRECTED BY PHYSICIAN      . losartan (COZAAR) 50 MG tablet Take 1 tablet (50 mg total) by mouth daily. For BLOOD PRESSURE  30 tablet  3  . Multiple Vitamins-Minerals (CENTRUM PO) Take by mouth.      Marland Kitchen omeprazole (PRILOSEC) 20 MG capsule Take 1 capsule (20 mg total) by mouth every morning.  30 capsule  6  . ranitidine (ZANTAC) 150 MG tablet Take 150 mg by mouth at bedtime.       No current facility-administered medications for this visit.    Allergies  as of 03/02/2013  . (No Known Allergies)    Past Medical History  Diagnosis Date  . Diabetes mellitus   . Hypertension   . GERD (gastroesophageal reflux disease)   . Hyperlipidemia   . Diverticulitis   . Thyroid disease     multinodular goiter  . Chronic pancreatitis   . Depression   . CRI (chronic renal insufficiency)   . Anemia, chronic disease   . Vertigo   . Diverticulosis     Past Surgical History  Procedure Laterality Date  . Nissen fundoplication    . Abdominal exploration surgery      abd pain-diverticulitis  . Colonoscopy  06/12/2000    Brodie-diffuse melanosis coli/mucousy exudate throughtout colon s/p bx (benign)  . Cholecystectomy  2001     Family History  Problem Relation Age of Onset  . Heart disease    . Arthritis    . Cancer Brother     prostate  . Diabetes    . Heart disease Mother   . Hypertension Mother   . Hyperlipidemia Mother   . Diabetes Mother   . Depression Father   . Diabetes Father   . Diabetes Sister   . Colon cancer Neg Hx     History   Social History  . Marital Status: Married    Spouse Name: N/A    Number of Children: 4  . Years of Education: 13   Occupational History  .     Social History Main Topics  . Smoking status: Never Smoker   . Smokeless tobacco: Not on file  . Alcohol Use: No  . Drug Use: No  . Sexually Active: Not on file   Other Topics Concern  . Not on file   Social History Narrative  . No narrative on file      ROS:  General: Negative for anorexia, weight loss, fever, chills, fatigue, weakness. Eyes: Negative for vision changes.  ENT: Negative for hoarseness , nasal congestn. CV: Negative for chest pain, angina, palpitations, dyspnea on exertion, peripheral edema.  Respiratory: Negative for dyspnea at rest, dyspnea on exertion, cough, sputum, wheezing.  GI: See history of present illness. GU:  Negative for dysuria, hematuria, urinary incontinence, urinary frequency, nocturnal urination.  MS: Negative for joint pain, low back pain.  Derm: Negative for rash or itching.  Neuro: Negative for weakness, abnormal sensation, seizure, frequent headaches, memory loss, confusion.  Psych: Negative for anxiety, depression, suicidal ideation, hallucinations.  Endo: Negative for unusual weight change.  Heme: Negative for bruising or bleeding. Allergy: Negative for rash or hives.    Physical Examination:  BP 146/70  Pulse 70  Temp(Src) 97.6 F (36.4 C) (Oral)  Ht 5\' 4"  (1.626 m)  Wt 167 lb (75.751 kg)  BMI 28.65 kg/m2   General: Well-nourished, well-developed in no acute distress.  Head: Normocephalic, atraumatic.   Eyes: Conjunctiva pink, no  icterus. Mouth: Oropharyngeal mucosa moist and pink , no lesions erythema or exudate. Neck: Supple without thyromegaly, masses, or lymphadenopathy.  Lungs: Clear to auscultation bilaterally.  Heart: Regular rate and rhythm, no murmurs rubs or gallops.  Abdomen: Bowel sounds are normal, mild epigastric tenderness, nondistended, no hepatosplenomegaly or masses, no abdominal bruits or    hernia , no rebound or guarding.   Rectal: not performed Extremities: No lower extremity edema. No clubbing or deformities.  Neuro: Alert and oriented x 4 , grossly normal neurologically.  Skin: Warm and dry, no rash or jaundice.   Psych: Alert and cooperative, normal  mood and affect.  Labs: Lab Results  Component Value Date   CREATININE 1.50* 12/02/2012   BUN 21 12/02/2012   NA 144 12/02/2012   K 4.4 12/02/2012   CL 110 12/02/2012   CO2 26 12/02/2012   Lab Results  Component Value Date   ALT 15 12/02/2012   AST 17 12/02/2012   ALKPHOS 107 12/02/2012   BILITOT 0.5 12/02/2012   Lab Results  Component Value Date   WBC 8.5 12/02/2012   HGB 12.1 12/02/2012   HCT 36.6 12/02/2012   MCV 90.4 12/02/2012   PLT 292 12/02/2012    Imaging Studies: No results found.

## 2013-03-02 NOTE — Patient Instructions (Addendum)
We have scheduled you for an upper endoscopy and colonoscopy with Dr. Darrick Penna. Please see separate instructions.

## 2013-03-03 ENCOUNTER — Encounter (HOSPITAL_COMMUNITY): Payer: Self-pay | Admitting: Pharmacy Technician

## 2013-03-03 ENCOUNTER — Telehealth: Payer: Self-pay | Admitting: *Deleted

## 2013-03-03 ENCOUNTER — Encounter: Payer: Self-pay | Admitting: Gastroenterology

## 2013-03-03 NOTE — Telephone Encounter (Signed)
Refill request: Nabumetone 500 mg. [facility]

## 2013-03-03 NOTE — Assessment & Plan Note (Signed)
Overdue for f/u colonoscopy with last one in 2001.  I have discussed the risks, alternatives, benefits with regards to but not limited to the risk of reaction to medication, bleeding, infection, perforation and the patient is agreeable to proceed. Written consent to be obtained.

## 2013-03-03 NOTE — Assessment & Plan Note (Signed)
Chronic GERD with recent poor control associated with dysphagia, epigastric discomfort. Recommend EGD/ED in near future with Dr. Darrick Penna.  I have discussed the risks, alternatives, benefits with regards to but not limited to the risk of reaction to medication, bleeding, infection, perforation and the patient is agreeable to proceed. Written consent to be obtained.  Continue Prilosec and Zantac.

## 2013-03-03 NOTE — Assessment & Plan Note (Signed)
H/O chronic pancreatitis based on prior CT findings dating back to at least 2001. Currently on low dose Creon. Clinically she has no signs of exocrine insufficiency such as weight loss/diarrhea. Consider weight based Creon if EGD unremarkable as to explain her abdominal pain.

## 2013-03-04 ENCOUNTER — Other Ambulatory Visit: Payer: Self-pay

## 2013-03-04 MED ORDER — IMIPRAMINE HCL 10 MG PO TABS: 30.0000 mg | ORAL_TABLET | Freq: Every day | ORAL | Status: DC

## 2013-03-04 NOTE — Progress Notes (Signed)
Forwarded to PCP.

## 2013-03-05 ENCOUNTER — Other Ambulatory Visit: Payer: Self-pay | Admitting: Orthopedic Surgery

## 2013-03-05 DIAGNOSIS — M1990 Unspecified osteoarthritis, unspecified site: Secondary | ICD-10-CM

## 2013-03-05 MED ORDER — NABUMETONE 500 MG PO TABS: 500.0000 mg | ORAL_TABLET | Freq: Two times a day (BID) | ORAL | Status: DC

## 2013-03-09 ENCOUNTER — Telehealth: Payer: Self-pay

## 2013-03-09 NOTE — Telephone Encounter (Signed)
PT SHOULD CHECK HER FINGERSTICKS AS USUAL. SHE SHOULD USE A SLIDING SCALE OF NOVOLOG FOR THE REST OF THE DAY. SHE SHOULD TAKE LEVEMIR 12U AT BEDTIME. SHE SHOULD USE A NOVOLOG SLIDING SCALE IN THE AM. HOLD LEVEMIR IN THE MORNING(APR 23).  FOR BLOOD SUGAR: 100-200 NO NOVOLOG 200-250: 2 U nOVOLOG 251-300: 4 U NOVOLOG 301-350: 6 U NOVOLOG > 351: 8 U NOVOLOG AND CALL [phone]

## 2013-03-09 NOTE — Telephone Encounter (Signed)
Pt is scheduled for TCS on 03/10/2013 with Dr. Darrick Penna. She was given instructions on insulin to take Novolog 7 units tid before meals today and Levemir 12 units bid.   Rhonda called for pt. Pt did not take any Novolog at breakfast. She took 15 units at lunch. She has not taken any Levemir today. Please revise instructions for pt.

## 2013-03-09 NOTE — Telephone Encounter (Signed)
REVIEWED.  

## 2013-03-09 NOTE — Telephone Encounter (Signed)
Pt's blood sugar was 127 right before lunch when she took the Novolog 15 units. Pt wanted me to give her friend, Bjorn Loser , the information. Went over all of the instructions and she also wrote down the sliding scale.

## 2013-03-10 ENCOUNTER — Encounter (HOSPITAL_COMMUNITY)
Admission: RE | Disposition: A | Discharge status: Home / Self Care | Payer: Self-pay | Source: Ambulatory Visit | Attending: Gastroenterology

## 2013-03-10 ENCOUNTER — Encounter (HOSPITAL_COMMUNITY): Payer: Self-pay | Admitting: *Deleted

## 2013-03-10 ENCOUNTER — Ambulatory Visit (HOSPITAL_COMMUNITY)
Admission: RE | Admit: 2013-03-10 | Discharge: 2013-03-10 | Disposition: A | Discharge status: Home / Self Care | Payer: Medicare Other | Source: Ambulatory Visit | Attending: Gastroenterology | Admitting: Gastroenterology

## 2013-03-10 DIAGNOSIS — E785 Hyperlipidemia, unspecified: Secondary | ICD-10-CM | POA: Insufficient documentation

## 2013-03-10 DIAGNOSIS — Z860100 Personal history of colon polyps, unspecified: Secondary | ICD-10-CM

## 2013-03-10 DIAGNOSIS — R1013 Epigastric pain: Secondary | ICD-10-CM

## 2013-03-10 DIAGNOSIS — K5730 Diverticulosis of large intestine without perforation or abscess without bleeding: Secondary | ICD-10-CM | POA: Insufficient documentation

## 2013-03-10 DIAGNOSIS — Z7982 Long term (current) use of aspirin: Secondary | ICD-10-CM | POA: Insufficient documentation

## 2013-03-10 DIAGNOSIS — Z1211 Encounter for screening for malignant neoplasm of colon: Secondary | ICD-10-CM | POA: Insufficient documentation

## 2013-03-10 DIAGNOSIS — K2960 Other gastritis without bleeding: Secondary | ICD-10-CM | POA: Insufficient documentation

## 2013-03-10 DIAGNOSIS — E119 Type 2 diabetes mellitus without complications: Secondary | ICD-10-CM | POA: Insufficient documentation

## 2013-03-10 DIAGNOSIS — Z794 Long term (current) use of insulin: Secondary | ICD-10-CM | POA: Insufficient documentation

## 2013-03-10 DIAGNOSIS — R1319 Other dysphagia: Secondary | ICD-10-CM

## 2013-03-10 DIAGNOSIS — I129 Hypertensive chronic kidney disease with stage 1 through stage 4 chronic kidney disease, or unspecified chronic kidney disease: Secondary | ICD-10-CM | POA: Insufficient documentation

## 2013-03-10 DIAGNOSIS — K2100 Gastro-esophageal reflux disease with esophagitis, without bleeding: Secondary | ICD-10-CM | POA: Insufficient documentation

## 2013-03-10 DIAGNOSIS — K648 Other hemorrhoids: Secondary | ICD-10-CM

## 2013-03-10 DIAGNOSIS — K3189 Other diseases of stomach and duodenum: Secondary | ICD-10-CM | POA: Insufficient documentation

## 2013-03-10 DIAGNOSIS — E042 Nontoxic multinodular goiter: Secondary | ICD-10-CM | POA: Insufficient documentation

## 2013-03-10 DIAGNOSIS — D649 Anemia, unspecified: Secondary | ICD-10-CM | POA: Insufficient documentation

## 2013-03-10 DIAGNOSIS — N189 Chronic kidney disease, unspecified: Secondary | ICD-10-CM | POA: Insufficient documentation

## 2013-03-10 DIAGNOSIS — K219 Gastro-esophageal reflux disease without esophagitis: Secondary | ICD-10-CM

## 2013-03-10 DIAGNOSIS — K2940 Chronic atrophic gastritis without bleeding: Secondary | ICD-10-CM | POA: Insufficient documentation

## 2013-03-10 DIAGNOSIS — Z79899 Other long term (current) drug therapy: Secondary | ICD-10-CM | POA: Insufficient documentation

## 2013-03-10 DIAGNOSIS — Z800 Family history of malignant neoplasm of digestive organs: Secondary | ICD-10-CM | POA: Insufficient documentation

## 2013-03-10 HISTORY — PX: MALONEY DILATION: SHX5535

## 2013-03-10 HISTORY — PX: SAVORY DILATION: SHX5439

## 2013-03-10 HISTORY — PX: COLONOSCOPY WITH ESOPHAGOGASTRODUODENOSCOPY (EGD): SHX5779

## 2013-03-10 LAB — GLUCOSE, CAPILLARY
Glucose-Capillary: 216 mg/dL — ABNORMAL HIGH (ref 70–99)
Glucose-Capillary: 86 mg/dL (ref 70–99)

## 2013-03-10 SURGERY — COLONOSCOPY WITH ESOPHAGOGASTRODUODENOSCOPY (EGD)
Anesthesia: Moderate Sedation

## 2013-03-10 MED ORDER — STERILE WATER FOR IRRIGATION IR SOLN
Status: DC | PRN
  Administered 2013-03-10: 10:00:00

## 2013-03-10 MED ORDER — BUTAMBEN-TETRACAINE-BENZOCAINE 2-2-14 % EX AERO
INHALATION_SPRAY | CUTANEOUS | Status: DC | PRN
  Administered 2013-03-10: 2 via TOPICAL

## 2013-03-10 MED ORDER — SODIUM CHLORIDE 0.9 % IV SOLN
INTRAVENOUS | Status: DC
  Administered 2013-03-10: 09:00:00 via INTRAVENOUS

## 2013-03-10 MED ORDER — PROMETHAZINE HCL 25 MG/ML IJ SOLN
INTRAMUSCULAR | Status: AC
  Filled 2013-03-10: qty 1

## 2013-03-10 MED ORDER — MEPERIDINE HCL 100 MG/ML IJ SOLN
INTRAMUSCULAR | Status: AC
  Filled 2013-03-10: qty 2

## 2013-03-10 MED ORDER — MEPERIDINE HCL 100 MG/ML IJ SOLN
INTRAMUSCULAR | Status: DC | PRN
  Administered 2013-03-10 (×4): 25 mg via INTRAVENOUS

## 2013-03-10 MED ORDER — MIDAZOLAM HCL 5 MG/5ML IJ SOLN
INTRAMUSCULAR | Status: AC
  Filled 2013-03-10: qty 10

## 2013-03-10 MED ORDER — MIDAZOLAM HCL 5 MG/5ML IJ SOLN
INTRAMUSCULAR | Status: DC | PRN
  Administered 2013-03-10: 1 mg via INTRAVENOUS
  Administered 2013-03-10: 2 mg via INTRAVENOUS
  Administered 2013-03-10 (×3): 1 mg via INTRAVENOUS

## 2013-03-10 MED ORDER — MINERAL OIL PO OIL
TOPICAL_OIL | ORAL | Status: AC
  Filled 2013-03-10: qty 30

## 2013-03-10 NOTE — Op Note (Addendum)
[facility] [address] [place], [address]   COLONOSCOPY PROCEDURE REPORT  PATIENT: Kylie Tate, Kylie Tate.  MR#: [id number] BIRTHDATE: 08/30/1941 , 71  yrs. old GENDER: Female ENDOSCOPIST: Jonette Eva, MD REFERRED WG:NFAOZHY Craven, M.D. PROCEDURE DATE:  03/10/2013 PROCEDURE:   INCOMPLETE Colonoscopy, screening INDICATIONS:Patient's immediate family history of colon cancer.: borther age > 17 MEDICATIONS: Demerol 75 mg IV and Versed 4 mg IV  DESCRIPTION OF PROCEDURE:    Physical exam was performed.  Informed consent was obtained from the patient after explaining the benefits, risks, and alternatives to procedure.  The patient was connected to monitor and placed in left lateral position. Continuous oxygen was provided by nasal cannula and IV medicine administered through an indwelling cannula.  After administration of sedation and rectal exam, the patients rectum was intubated and the EC-[id number] ([id number])  colonoscope was advanced under direct visualization to the PROXIMAL TRANSVERSE COLON. The scope was removed slowly by carefully examining the color, texture, anatomy, and integrity mucosa on the way out.  The patient was recovered in endoscopy and discharged home in satisfactory condition.       COLON FINDINGS: The colonic mucosa appeared normal. EXAM LIMITED DUE TO POOR BOWEL PREP. NO COLON CANCER SEEN. Moderate sized internal hemorrhoids were found.  PREP QUALITY: poor.  COMPLICATIONS: None  ENDOSCOPIC IMPRESSION: 1.  INCOMPLETE COLONOSCOPY 2.   Moderate sized internal hemorrhoids   RECOMMENDATIONS: REPEAT TCS IN 1 MO WITH 2-3 DAY DIET MODIFICATION/BOWEL PREP       _______________________________ Rosalie DoctorJonette Eva, MD 03/10/2013 11:08 AM

## 2013-03-10 NOTE — H&P (Signed)
Primary Care Physician:  [name], MD Primary Gastroenterologist:  Dr. Darrick Penna  Pre-Procedure History & Physical: HPI:  Kylie Tate is a 72 y.o. female here for dyspepsia & screening.  Past Medical History  Diagnosis Date  . Diabetes mellitus   . Hypertension   . GERD (gastroesophageal reflux disease)   . Hyperlipidemia   . Diverticulitis   . Thyroid disease     multinodular goiter  . Chronic pancreatitis   . Depression   . CRI (chronic renal insufficiency)   . Anemia, chronic disease   . Vertigo   . Diverticulosis     Past Surgical History  Procedure Laterality Date  . Nissen fundoplication    . Abdominal exploration surgery      abd pain-diverticulitis  . Colonoscopy  06/12/2000    Brodie-diffuse melanosis coli/mucousy exudate throughtout colon s/p bx (benign)  . Cholecystectomy  2001    Prior to Admission medications   Medication Sig Start Date End Date Taking? Authorizing Provider  aspirin 81 MG tablet Take 81 mg by mouth daily.   Yes Historical Provider, MD  atorvastatin (LIPITOR) 10 MG tablet Take 1 tablet (10 mg total) by mouth daily. For CHOLESTEROL 01/14/13  Yes Salley Scarlet, MD  diazepam (VALIUM) 5 MG tablet Take 5 mg by mouth 3 (three) times daily as needed. NERVES or AS DIRECTED BY PHYSICIAN   Yes Historical Provider, MD  diltiazem (CARDIZEM CD) 180 MG 24 hr capsule TAKE ONE CAPSULE BY MOUTH EVERY DAY 02/04/13  Yes Salley Scarlet, MD  gabapentin (NEURONTIN) 100 MG capsule Take 100 mg by mouth 2 (two) times daily.   Yes Historical Provider, MD  imipramine (TOFRANIL) 10 MG tablet Take 3 tablets (30 mg total) by mouth at bedtime. FOR SLEEP/MOOD or AS DIRECTED BY PHYSICIAN 03/04/13  Yes Salley Scarlet, MD  insulin aspart (NOVOLOG FLEXPEN) 100 UNIT/ML injection Inject 15 Units into the skin 3 (three) times daily before meals.    Yes Historical Provider, MD  insulin detemir (LEVEMIR FLEXPEN) 100 UNIT/ML injection Inject 25 Units into the skin 2 (two)  times daily.    Yes Historical Provider, MD  levothyroxine (SYNTHROID, LEVOTHROID) 50 MCG tablet TAKE 1 TABLET (50 MCG TOTAL)  BY MOUTH DAILY. FOR THYROID 01/05/13  Yes Kerri Perches, MD  lipase/protease/amylase (CREON) 12000 UNITS CPEP Take 2 capsules by mouth 3 (three) times daily before meals. FOR DIGESTION or AS DIRECTED BY PHYSICIAN   Yes Historical Provider, MD  losartan (COZAAR) 50 MG tablet Take 1 tablet (50 mg total) by mouth daily. For BLOOD PRESSURE 01/27/13  Yes Salley Scarlet, MD  Multiple Vitamins-Minerals (CENTRUM PO) Take by mouth.   Yes Historical Provider, MD  nabumetone (RELAFEN) 500 MG tablet Take 1 tablet (500 mg total) by mouth 2 (two) times daily. 03/05/13  Yes Vickki Hearing, MD  omeprazole (PRILOSEC) 20 MG capsule Take 1 capsule (20 mg total) by mouth every morning. 02/26/13  Yes Salley Scarlet, MD  polyethylene glycol-electrolytes (TRILYTE) 420 G solution Take 4,000 mLs by mouth as directed. 03/02/13  Yes West Bali, MD  ranitidine (ZANTAC) 150 MG tablet Take 150 mg by mouth at bedtime.   Yes Historical Provider, MD    Allergies as of 03/02/2013  . (No Known Allergies)    Family History  Problem Relation Age of Onset  . Heart disease    . Arthritis    . Cancer Brother     prostate  . Diabetes    .  Heart disease Mother   . Hypertension Mother   . Hyperlipidemia Mother   . Diabetes Mother   . Depression Father   . Diabetes Father   . Diabetes Sister   . Colon cancer Neg Hx   . Pancreatitis Neg Hx     History   Social History  . Marital Status: Married    Spouse Name: N/A    Number of Children: 4  . Years of Education: 13   Occupational History  .     Social History Main Topics  . Smoking status: Never Smoker   . Smokeless tobacco: Not on file  . Alcohol Use: No  . Drug Use: No  . Sexually Active: Not on file   Other Topics Concern  . Not on file   Social History Narrative  . No narrative on file    Review of Systems: See  HPI, otherwise negative ROS   Physical Exam: BP 169/83  Pulse 81  Temp(Src) 97.7 F (36.5 C) (Oral)  Ht 5\' 4"  (1.626 m)  Wt 167 lb (75.751 kg)  BMI 28.65 kg/m2  SpO2 93% General:   Alert,  pleasant and cooperative in NAD Head:  Normocephalic and atraumatic. Neck:  Supple; Lungs:  Clear throughout to auscultation.    Heart:  Regular rate and rhythm. Abdomen:  Soft, nontender and nondistended. Normal bowel sounds, without guarding, and without rebound.   Neurologic:  Alert and  oriented x4;  grossly normal neurologically.  Impression/Plan:    SCREENING/dyspepsia  Plan:  1. TCS/EGD TODAY

## 2013-03-11 ENCOUNTER — Other Ambulatory Visit: Payer: Self-pay | Admitting: Gastroenterology

## 2013-03-11 ENCOUNTER — Telehealth: Payer: Self-pay | Admitting: Gastroenterology

## 2013-03-11 DIAGNOSIS — Z1211 Encounter for screening for malignant neoplasm of colon: Secondary | ICD-10-CM

## 2013-03-11 NOTE — Telephone Encounter (Signed)
Message copied by Glendora Score on Thu Mar 11, 2013  9:02 AM ------      Message from: Jonette Eva L      Created: Wed Mar 10, 2013 12:20 PM       3 DAYS  AND 2 DAYS BEFORE:       TAKE INSULIN AS USUAL            1 DAY BEFORE- USE INSULIN SLIDING SCALE . DO NOT TAKE LEVEMIR. THEY SHOULD HAVE IT WRITTEN DOWN. IF YOU NEED TO COPY IT FOR THEM SEE TC APR 22.WAT ------

## 2013-03-11 NOTE — Telephone Encounter (Signed)
REVIEWED.  

## 2013-03-11 NOTE — Telephone Encounter (Signed)
I have her R/S for Monday May 12 and I will change prep to Prepopick and I have asked [name] to come in the office so I can go over prep instructions with her

## 2013-03-15 ENCOUNTER — Encounter (HOSPITAL_COMMUNITY): Payer: Self-pay | Admitting: Pharmacy Technician

## 2013-03-15 ENCOUNTER — Encounter: Payer: Self-pay | Admitting: General Practice

## 2013-03-15 NOTE — Op Note (Signed)
[facility] [address] [place], [address]   ENDOSCOPY PROCEDURE REPORT  PATIENT: Kylie, Tate.  MR#: [id number] BIRTHDATE: [birth year] , 71  yrs. old GENDER: Female  ENDOSCOPIST: Jonette Eva, MD REFERRED XB:[id number] [place], M.D.  PROCEDURE DATE: 03/10/2013 PROCEDURE:   EGD w/ biopsy INDICATIONS:Dyspepsia. DENIES DYSPHAGIA. MEDICATIONS: TCS +Demerol 25 mg IV and Versed 2 mg IV TOPICAL ANESTHETIC:   Cetacaine Spray  DESCRIPTION OF PROCEDURE:     Physical exam was performed.  Informed consent was obtained from the patient after explaining the benefits, risks, and alternatives to the procedure.  The patient was connected to the monitor and placed in the left lateral position.  Continuous oxygen was provided by nasal cannula and IV medicine administered through an indwelling cannula.  After administration of sedation, the patients esophagus was intubated and the EG-[id number] ([id number])  endoscope was advanced under direct visualization to the second portion of the duodenum.  The scope was removed slowly by carefully examining the color, texture, anatomy, and integrity of the mucosa on the way out.  The patient was recovered in endoscopy and discharged home in satisfactory condition.   ESOPHAGUS: FEW LINEAR EROSIONS IN THE DISTAL ESOPAHGUS.  CHANGES IN DISTAL ESOPHAGUS POSSIBLY DUE TO BARRETT'S.  BIOPSIES OBTAINED VIA COLD FORCEPS.   STOMACH: Moderate erosive gastritis (inflammation) was found in the gastric antrum.  Multiple biopsies were performed. DUODENUM: The duodenal mucosa showed no abnormalities in the bulb and second portion of the duodenum. COMPLICATIONS:   None  ENDOSCOPIC IMPRESSION: 1.   MILD REFLUX ESOPHAGITIS, 2.   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   MODERATE Erosive gastritis  MOST LIKELY DUE TO NSAIDS  RECOMMENDATIONS: CONTINUE OMEPRAZOLE EVERY MORNING.  ZANTAC HELPS MOST WHEN USED AS NEEDED. AVOID ITEMS THAT TRIGGER GASTRITIS. FOLLOW A LOW  FAT/HIGH FIBER DIET.  AVOID ITEMS THAT CAUSE BLOATING.  BIOPSY WILL BE BACK IN 7 DAYS. FOLLOW UP IN 4 MOS.   REPEAT EXAM:   _______________________________ Rosalie DoctorJonette Eva, MD 03/15/2013 9:11 AM       PATIENT NAME:  Kylie Tate MR#: [id number]

## 2013-03-16 ENCOUNTER — Encounter (HOSPITAL_COMMUNITY): Payer: Self-pay | Admitting: Gastroenterology

## 2013-03-17 ENCOUNTER — Other Ambulatory Visit: Payer: Self-pay | Admitting: Gastroenterology

## 2013-03-17 MED ORDER — PEG-KCL-NACL-NASULF-NA ASC-C 100 G PO SOLR: 1.0000 | ORAL | Status: DC

## 2013-03-18 ENCOUNTER — Telehealth: Payer: Self-pay | Admitting: Gastroenterology

## 2013-03-18 NOTE — Telephone Encounter (Signed)
Please call pt. HER stomach Bx shows gastritis FROM ASPIRIN AND RELAFEN.     RESCHEDULE YOUR COLONOSCOPY WITHIN THE NEXT 2-3 WEEKS WITH A DIFFERENT PREP.  DRINK WATER TO KEEP YOUR URINE LIGHT YELLOW.  CONTINUE OMEPRAZOLE EVERY MORNING. ZANTAC HELPS MOST WHEN USED AS NEEDED.  AVOID ITEMS THAT TRIGGER GASTRITIS.   FOLLOW A LOW FAT/HIGH FIBER DIET.   FOLLOW UP IN 4 MOS LL E30.

## 2013-03-18 NOTE — Telephone Encounter (Signed)
Pt is aware of OV on 8/21 at 10 with LSL

## 2013-03-18 NOTE — Telephone Encounter (Signed)
Called and informed pt of results

## 2013-03-23 ENCOUNTER — Ambulatory Visit (HOSPITAL_COMMUNITY)
Admission: RE | Admit: 2013-03-23 | Discharge: 2013-03-23 | Disposition: A | Discharge status: Home / Self Care | Payer: Medicare Other | Source: Ambulatory Visit | Attending: "Endocrinology | Admitting: "Endocrinology

## 2013-03-23 ENCOUNTER — Other Ambulatory Visit (HOSPITAL_COMMUNITY): Payer: Self-pay | Admitting: "Endocrinology

## 2013-03-23 DIAGNOSIS — Z09 Encounter for follow-up examination after completed treatment for conditions other than malignant neoplasm: Secondary | ICD-10-CM | POA: Insufficient documentation

## 2013-03-23 DIAGNOSIS — E049 Nontoxic goiter, unspecified: Secondary | ICD-10-CM

## 2013-03-23 DIAGNOSIS — E042 Nontoxic multinodular goiter: Secondary | ICD-10-CM | POA: Insufficient documentation

## 2013-04-02 ENCOUNTER — Other Ambulatory Visit: Payer: Self-pay | Admitting: Family Medicine

## 2013-04-07 ENCOUNTER — Other Ambulatory Visit: Payer: Self-pay | Admitting: Family Medicine

## 2013-04-07 ENCOUNTER — Encounter: Payer: Self-pay | Admitting: Family Medicine

## 2013-04-14 ENCOUNTER — Telehealth: Payer: Self-pay | Admitting: Gastroenterology

## 2013-04-14 NOTE — Telephone Encounter (Signed)
Case manager called with questions about patient continuing with her Creon. Please advise. Kylie Tate, case manager [phone]

## 2013-04-14 NOTE — Telephone Encounter (Signed)
LMOM to call.

## 2013-04-14 NOTE — Telephone Encounter (Signed)
Alisa  Called back and said the pt is not sure if she is to continue creon and what dosage she is to be on. She said the pt gets mixed up with her meds because she has so many. Please advise!

## 2013-04-15 ENCOUNTER — Other Ambulatory Visit: Payer: Self-pay

## 2013-04-15 DIAGNOSIS — R197 Diarrhea, unspecified: Secondary | ICD-10-CM

## 2013-04-15 NOTE — Telephone Encounter (Signed)
LMOM all of the info for Kylie Tate. New Rx to [facility]. Lab order and container at front for pick up. Asked her to please call me and let me know that she got the message.

## 2013-04-15 NOTE — Telephone Encounter (Signed)
Check C.Diff PCR. Continue Creon 48000 with meals, 86578 with snacks. Can give new RX for Creaon 24000, two po with meals, 1 po with snacks. #240, 5 rf.

## 2013-04-15 NOTE — Telephone Encounter (Signed)
Rx was called to [place] at [place].

## 2013-04-15 NOTE — Telephone Encounter (Signed)
REVIEWED.  

## 2013-04-15 NOTE — Telephone Encounter (Signed)
T/C from [employer]. Said she got a phone call from pt, who said she had forgot to report some foul smelling persistent diarrhea since Sat. She has been taking Immodium bid but it is not helping. York Spaniel she got this after being with her husband in the hospital who had a stroke). Please advise! [name] can be reached at [phone].

## 2013-04-15 NOTE — Progress Notes (Signed)
Order for C-Diff. ( See separate phone note).

## 2013-04-16 NOTE — Telephone Encounter (Signed)
Pt called and she will come by the office today to get the stool container and order. She is aware that we will close at noon today.

## 2013-04-19 ENCOUNTER — Telehealth: Payer: Self-pay

## 2013-04-19 LAB — CLOSTRIDIUM DIFFICILE BY PCR: Toxigenic C. Difficile by PCR: NOT DETECTED

## 2013-04-19 NOTE — Telephone Encounter (Signed)
Tobi Bastos, The C-Diff PCR result is back and said not detected. Please advise!

## 2013-04-19 NOTE — Telephone Encounter (Signed)
Forwarding to Camille.  

## 2013-04-19 NOTE — Telephone Encounter (Signed)
Pt called and she is still having diarrhea 5-6 times daily for the last 3 weeks. She is taking Immodium bid and it is not helping. She is scheduled for her colonoscopy on 04/26/2013. She turned in her C-Diff PCR on 04/15/2013. I checked with customer service at [facility] and they said her results should ready this afternoon or tomorrow. Please advise!

## 2013-04-19 NOTE — Telephone Encounter (Signed)
Is she having abdominal pain? Is this postprandial? Need to make sure she is taking Creon as directed.   If postprandial component, could trial Bentyl 10 mg po before meals and at bedtime. Hold for constipation.

## 2013-04-19 NOTE — Telephone Encounter (Signed)
Called and informed pt. Called the lab and spoke to [employer], who is going to check on speeding up the results of C-Diff PCR and she will give me a call back.

## 2013-04-19 NOTE — Telephone Encounter (Signed)
LMOM to call.

## 2013-04-19 NOTE — Telephone Encounter (Signed)
I spoke with the patient and she is going to have her sister Delman Kitten call me so I cal explain it to her.

## 2013-04-19 NOTE — Telephone Encounter (Signed)
Will await results. Use Kaopectate in the interim. Follow a bland diet for now until we get the results. Stay hydrated, make sure urine is clear, good urine output. See if lab can put a rush on stool sample.

## 2013-04-19 NOTE — Telephone Encounter (Signed)
Kylie Tate, Pt has questions about her prep and instructions. Please call her and explain if you can.

## 2013-04-20 ENCOUNTER — Encounter (HOSPITAL_COMMUNITY): Payer: Self-pay | Admitting: Pharmacy Technician

## 2013-04-20 NOTE — Telephone Encounter (Signed)
Ok. Follow low-fat diet.  No changes to regimen. TCS as planned.

## 2013-04-20 NOTE — Telephone Encounter (Signed)
LMOM that [facility] has a prescription for her prep.

## 2013-04-20 NOTE — Telephone Encounter (Signed)
I called pt and she said that she is not having diarrhea now. ( See note dated 04/19/2013). She said she had just a little stool this AM, but not a complete BM.   She is not having Abdominal pain now, it was only when she was having the diarrhea.   She said she is taking the Creon as directed on the bottle.

## 2013-04-20 NOTE — Telephone Encounter (Signed)
I called [facility] and gave a verbal order to [place] for Movie Prep. Called to let pt know and there were many rings and no answer.

## 2013-04-20 NOTE — Telephone Encounter (Signed)
Called pt and informed. She said that does not have her prescription and I will check on that and let her know.

## 2013-04-23 ENCOUNTER — Telehealth: Payer: Self-pay

## 2013-04-23 NOTE — Telephone Encounter (Signed)
Pt called and wanted to review diabetic meds instructions ( for prep/ colonoscopy). I reviewed those with her and she said she was thinking about canceling the colonoscopy because her husband has been in the hospital for 2-3 weeks with a stroke. I told her if she was going to cancel it would be better to do so today instead of waiting til day of procedure. She said, oh, I am going to go on and do it.

## 2013-04-26 ENCOUNTER — Encounter (HOSPITAL_COMMUNITY)
Admission: RE | Disposition: A | Discharge status: Home / Self Care | Payer: Self-pay | Source: Ambulatory Visit | Attending: Gastroenterology

## 2013-04-26 ENCOUNTER — Ambulatory Visit (HOSPITAL_COMMUNITY)
Admission: RE | Admit: 2013-04-26 | Discharge: 2013-04-26 | Disposition: A | Discharge status: Home / Self Care | Payer: Medicare Other | Source: Ambulatory Visit | Attending: Gastroenterology | Admitting: Gastroenterology

## 2013-04-26 ENCOUNTER — Encounter (HOSPITAL_COMMUNITY): Payer: Self-pay | Admitting: *Deleted

## 2013-04-26 DIAGNOSIS — I10 Essential (primary) hypertension: Secondary | ICD-10-CM | POA: Insufficient documentation

## 2013-04-26 DIAGNOSIS — Z1211 Encounter for screening for malignant neoplasm of colon: Secondary | ICD-10-CM

## 2013-04-26 DIAGNOSIS — Z794 Long term (current) use of insulin: Secondary | ICD-10-CM | POA: Insufficient documentation

## 2013-04-26 DIAGNOSIS — Z01812 Encounter for preprocedural laboratory examination: Secondary | ICD-10-CM | POA: Insufficient documentation

## 2013-04-26 DIAGNOSIS — K5730 Diverticulosis of large intestine without perforation or abscess without bleeding: Secondary | ICD-10-CM

## 2013-04-26 DIAGNOSIS — K648 Other hemorrhoids: Secondary | ICD-10-CM

## 2013-04-26 DIAGNOSIS — E119 Type 2 diabetes mellitus without complications: Secondary | ICD-10-CM | POA: Insufficient documentation

## 2013-04-26 DIAGNOSIS — Q438 Other specified congenital malformations of intestine: Secondary | ICD-10-CM | POA: Insufficient documentation

## 2013-04-26 HISTORY — PX: COLONOSCOPY: SHX5424

## 2013-04-26 LAB — GLUCOSE, CAPILLARY
Glucose-Capillary: 210 mg/dL — ABNORMAL HIGH (ref 70–99)
Glucose-Capillary: 194 mg/dL — ABNORMAL HIGH (ref 70–99)

## 2013-04-26 SURGERY — COLONOSCOPY
Anesthesia: Moderate Sedation

## 2013-04-26 MED ORDER — MEPERIDINE HCL 100 MG/ML IJ SOLN
INTRAMUSCULAR | Status: DC | PRN
  Administered 2013-04-26: 50 mg via INTRAVENOUS
  Administered 2013-04-26: 25 mg via INTRAVENOUS

## 2013-04-26 MED ORDER — SODIUM CHLORIDE 0.9 % IV SOLN
INTRAVENOUS | Status: DC
Start: 2013-04-26 — End: 2013-04-26
  Administered 2013-04-26: 08:00:00 via INTRAVENOUS

## 2013-04-26 MED ORDER — MEPERIDINE HCL 100 MG/ML IJ SOLN
INTRAMUSCULAR | Status: AC
  Filled 2013-04-26: qty 1

## 2013-04-26 MED ORDER — PROMETHAZINE HCL 25 MG/ML IJ SOLN
INTRAMUSCULAR | Status: AC
  Filled 2013-04-26: qty 1

## 2013-04-26 MED ORDER — STERILE WATER FOR IRRIGATION IR SOLN
Status: DC | PRN
  Administered 2013-04-26: 09:00:00

## 2013-04-26 MED ORDER — MIDAZOLAM HCL 5 MG/5ML IJ SOLN
INTRAMUSCULAR | Status: DC | PRN
  Administered 2013-04-26: 2 mg via INTRAVENOUS
  Administered 2013-04-26: 1 mg via INTRAVENOUS
  Administered 2013-04-26: 2 mg via INTRAVENOUS

## 2013-04-26 MED ORDER — MIDAZOLAM HCL 5 MG/5ML IJ SOLN
INTRAMUSCULAR | Status: AC
  Filled 2013-04-26: qty 10

## 2013-04-26 NOTE — H&P (Signed)
Primary Care Physician:  [name], MD Primary Gastroenterologist:  Dr. Darrick Penna  Pre-Procedure History & Physical: HPI:  Kylie Tate is a 72 y.o. female here for COLON CANCER SCREENING.   Past Medical History  Diagnosis Date  . Diabetes mellitus   . Hypertension   . GERD (gastroesophageal reflux disease)   . Hyperlipidemia   . Diverticulitis   . Thyroid disease     multinodular goiter  . Chronic pancreatitis   . Depression   . CRI (chronic renal insufficiency)   . Anemia, chronic disease   . Vertigo   . Diverticulosis     Past Surgical History  Procedure Laterality Date  . Nissen fundoplication    . Abdominal exploration surgery      abd pain-diverticulitis  . Colonoscopy  06/12/2000    Brodie-diffuse melanosis coli/mucousy exudate throughtout colon s/p bx (benign)  . Cholecystectomy  2001  . Colonoscopy with esophagogastroduodenoscopy (egd) N/A 03/10/2013    Procedure: COLONOSCOPY WITH ESOPHAGOGASTRODUODENOSCOPY (EGD);  Surgeon: West Bali, MD;  [facility];  Service: Endoscopy;  Laterality: N/A;  9:45  . Maloney dilation N/A 03/10/2013    Procedure: [name] DILATION;  Surgeon: West Bali, MD;  [facility];  Service: Endoscopy;  Laterality: N/A;  . Savory dilation N/A 03/10/2013    Procedure: SAVORY DILATION;  Surgeon: West Bali, MD;  [facility];  Service: Endoscopy;  Laterality: N/A;    Prior to Admission medications   Medication Sig Start Date End Date Taking? Authorizing Provider  aspirin EC 81 MG tablet Take 81 mg by mouth daily.   Yes Historical Provider, MD  atorvastatin (LIPITOR) 10 MG tablet Take 10 mg by mouth daily.   Yes Historical Provider, MD  diazepam (VALIUM) 5 MG tablet Take 5 mg by mouth 3 (three) times daily as needed. NERVES or AS DIRECTED BY PHYSICIAN   Yes Historical Provider, MD  diltiazem (CARDIZEM CD) 180 MG 24 hr capsule Take 180 mg by mouth daily.   Yes Historical Provider, MD  imipramine  (TOFRANIL) 10 MG tablet Take 3 tablets (30 mg total) by mouth at bedtime. FOR SLEEP/MOOD or AS DIRECTED BY PHYSICIAN 03/04/13  Yes Salley Scarlet, MD  insulin aspart (NOVOLOG FLEXPEN) 100 UNIT/ML injection Inject 15 Units into the skin 3 (three) times daily before meals.    Yes Historical Provider, MD  insulin detemir (LEVEMIR FLEXPEN) 100 UNIT/ML injection Inject 25 Units into the skin 2 (two) times daily.    Yes Historical Provider, MD  levothyroxine (SYNTHROID, LEVOTHROID) 50 MCG tablet Take 50 mcg by mouth daily.   Yes Historical Provider, MD  lipase/protease/amylase (CREON) 12000 UNITS CPEP Take 2 capsules by mouth 3 (three) times daily before meals. FOR DIGESTION or AS DIRECTED BY PHYSICIAN   Yes Historical Provider, MD  losartan (COZAAR) 50 MG tablet Take 1 tablet (50 mg total) by mouth daily. For BLOOD PRESSURE 01/27/13  Yes Salley Scarlet, MD  Multiple Vitamins-Minerals (CENTRUM PO) Take by mouth.   Yes Historical Provider, MD  nabumetone (RELAFEN) 500 MG tablet Take 1 tablet (500 mg total) by mouth 2 (two) times daily. 03/05/13  Yes Vickki Hearing, MD  omeprazole (PRILOSEC) 20 MG capsule Take 1 capsule (20 mg total) by mouth every morning. 02/26/13  Yes Salley Scarlet, MD  peg 3350 powder (MOVIPREP) 100 G SOLR Take 1 kit (100 g total) by mouth as directed. 03/17/13  Yes West Bali, MD  gabapentin (NEURONTIN) 100 MG  capsule Take 100 mg by mouth 2 (two) times daily.    Historical Provider, MD    Allergies as of 03/11/2013  . (No Known Allergies)    Family History  Problem Relation Age of Onset  . Heart disease    . Arthritis    . Cancer Brother     prostate  . Diabetes    . Heart disease Mother   . Hypertension Mother   . Hyperlipidemia Mother   . Diabetes Mother   . Depression Father   . Diabetes Father   . Diabetes Sister   . Colon cancer Neg Hx   . Pancreatitis Neg Hx     History   Social History  . Marital Status: Married    Spouse Name: N/A    Number of  Children: 4  . Years of Education: 13   Occupational History  .     Social History Main Topics  . Smoking status: Never Smoker   . Smokeless tobacco: Not on file  . Alcohol Use: No  . Drug Use: No  . Sexually Active: Not on file   Other Topics Concern  . Not on file   Social History Narrative  . No narrative on file    Review of Systems: See HPI, otherwise negative ROS   Physical Exam: BP 148/72  Pulse 80  Temp(Src) 98.1 F (36.7 C) (Oral)  Resp 15  Ht 5\' 4"  (1.626 m)  Wt 180 lb (81.647 kg)  BMI 30.88 kg/m2  SpO2 96% General:   Alert,  pleasant and cooperative in NAD Head:  Normocephalic and atraumatic. Neck:  Supple; Lungs:  Clear throughout to auscultation.    Heart:  Regular rate and rhythm. Abdomen:  Soft, nontender and nondistended. Normal bowel sounds, without guarding, and without rebound.   Neurologic:  Alert and  oriented x4;  grossly normal neurologically.  Impression/Plan:     SCREENING  Plan:  1. TCS TODAY

## 2013-04-26 NOTE — Telephone Encounter (Signed)
REVIEWED.  

## 2013-04-26 NOTE — Op Note (Signed)
[facility] [address] [place], [address]   COLONOSCOPY PROCEDURE REPORT  PATIENT: Kylie Tate, Kylie Tate.  MR#: [id number] BIRTHDATE: 1941-11-08 , 71  yrs. old GENDER: Female ENDOSCOPIST: Jonette Eva, MD REFERRED AO:[id number] Ramirez-Perez, M.D. PROCEDURE DATE:  04/26/2013 PROCEDURE:   Colonoscopy, screening INDICATIONS:Average risk patient for colon cancer. MEDICATIONS: Demerol 75 mg IV and Versed 5 mg IV  DESCRIPTION OF PROCEDURE:    Physical exam was performed.  Informed consent was obtained from the patient after explaining the benefits, risks, and alternatives to procedure.  The patient was connected to monitor and placed in left lateral position. Continuous oxygen was provided by nasal cannula and IV medicine administered through an indwelling cannula.  After administration of sedation and rectal exam, the patients rectum was intubated and the EC-[id number] ([id number])  colonoscope was advanced under direct visualization to the ileum.  The scope was removed slowly by carefully examining the color, texture, anatomy, and integrity mucosa on the way out.  The patient was recovered in endoscopy and discharged home in satisfactory condition.       COLON FINDINGS: The mucosa appeared normal in the terminal ileum.  , Mild diverticulosis was noted in the sigmoid colon.  , Small internal hemorrhoids were found. The colon IS redundant.  Manual abdominal counter-pressure was used to reach the cecum & TO HER BACK AND ABDOMEN to reach the cecum.  PREP QUALITY: good.  CECAL W/D TIME: 15 minutes     COMPLICATIONS: None  ENDOSCOPIC IMPRESSION: 1.   Mild diverticulosis was noted in the sigmoid colon 2.   Small internal hemorrhoids 3.   The colon IS redundant   RECOMMENDATIONS: AWAIT BIOPSY HIGH FIBER DIET TCS WITH OVERTUBE IN 10-15 YEARS IF BENEFITS OUTWEIGH THE RISKS       _______________________________ Rosalie DoctorJonette Eva, MD 04/26/2013 9:58 AM

## 2013-04-28 ENCOUNTER — Encounter (HOSPITAL_COMMUNITY): Payer: Self-pay | Admitting: Gastroenterology

## 2013-05-04 ENCOUNTER — Other Ambulatory Visit: Payer: Self-pay | Admitting: Family Medicine

## 2013-05-04 ENCOUNTER — Encounter: Payer: Self-pay | Admitting: Gastroenterology

## 2013-05-05 NOTE — Telephone Encounter (Signed)
Med refilled.

## 2013-05-14 ENCOUNTER — Telehealth: Payer: Self-pay | Admitting: Family Medicine

## 2013-05-14 MED ORDER — INSULIN PEN NEEDLE 32G X 4 MM MISC: Status: DC

## 2013-05-14 NOTE — Telephone Encounter (Signed)
Med refilled.

## 2013-06-01 ENCOUNTER — Ambulatory Visit: Payer: Self-pay | Admitting: Family Medicine

## 2013-06-01 ENCOUNTER — Ambulatory Visit: Payer: Medicare Other | Admitting: Family Medicine

## 2013-06-02 NOTE — Progress Notes (Signed)
EGD APR 2014 GASTRITIS, EROSIVE ESOPHAGITIS  INCOMPLETE TCS APR 2014 DUE TO POOR PREP, MOD IH APR 2014 TCS TICS/IH  REVIEWED.

## 2013-06-04 ENCOUNTER — Encounter (HOSPITAL_COMMUNITY): Payer: Self-pay | Admitting: *Deleted

## 2013-06-04 ENCOUNTER — Emergency Department (HOSPITAL_COMMUNITY)
Admission: EM | Admit: 2013-06-04 | Discharge: 2013-06-05 | Disposition: A | Discharge status: Home / Self Care | Payer: Medicare Other | Attending: Emergency Medicine | Admitting: Emergency Medicine

## 2013-06-04 DIAGNOSIS — F329 Major depressive disorder, single episode, unspecified: Secondary | ICD-10-CM | POA: Insufficient documentation

## 2013-06-04 DIAGNOSIS — Z862 Personal history of diseases of the blood and blood-forming organs and certain disorders involving the immune mechanism: Secondary | ICD-10-CM | POA: Insufficient documentation

## 2013-06-04 DIAGNOSIS — Z7982 Long term (current) use of aspirin: Secondary | ICD-10-CM | POA: Insufficient documentation

## 2013-06-04 DIAGNOSIS — E785 Hyperlipidemia, unspecified: Secondary | ICD-10-CM | POA: Insufficient documentation

## 2013-06-04 DIAGNOSIS — E1169 Type 2 diabetes mellitus with other specified complication: Secondary | ICD-10-CM | POA: Insufficient documentation

## 2013-06-04 DIAGNOSIS — R358 Other polyuria: Secondary | ICD-10-CM | POA: Insufficient documentation

## 2013-06-04 DIAGNOSIS — R42 Dizziness and giddiness: Secondary | ICD-10-CM | POA: Insufficient documentation

## 2013-06-04 DIAGNOSIS — Z79899 Other long term (current) drug therapy: Secondary | ICD-10-CM | POA: Insufficient documentation

## 2013-06-04 DIAGNOSIS — I129 Hypertensive chronic kidney disease with stage 1 through stage 4 chronic kidney disease, or unspecified chronic kidney disease: Secondary | ICD-10-CM | POA: Insufficient documentation

## 2013-06-04 DIAGNOSIS — Z8719 Personal history of other diseases of the digestive system: Secondary | ICD-10-CM | POA: Insufficient documentation

## 2013-06-04 DIAGNOSIS — N189 Chronic kidney disease, unspecified: Secondary | ICD-10-CM | POA: Insufficient documentation

## 2013-06-04 DIAGNOSIS — R739 Hyperglycemia, unspecified: Secondary | ICD-10-CM

## 2013-06-04 DIAGNOSIS — R51 Headache: Secondary | ICD-10-CM | POA: Insufficient documentation

## 2013-06-04 DIAGNOSIS — K219 Gastro-esophageal reflux disease without esophagitis: Secondary | ICD-10-CM | POA: Insufficient documentation

## 2013-06-04 DIAGNOSIS — E079 Disorder of thyroid, unspecified: Secondary | ICD-10-CM | POA: Insufficient documentation

## 2013-06-04 DIAGNOSIS — F3289 Other specified depressive episodes: Secondary | ICD-10-CM | POA: Insufficient documentation

## 2013-06-04 DIAGNOSIS — R3589 Other polyuria: Secondary | ICD-10-CM | POA: Insufficient documentation

## 2013-06-04 LAB — BASIC METABOLIC PANEL
BUN: 36 mg/dL — ABNORMAL HIGH (ref 6–23)
Chloride: 95 mEq/L — ABNORMAL LOW (ref 96–112)
Creatinine, Ser: 1.36 mg/dL — ABNORMAL HIGH (ref 0.50–1.10)
GFR calc Af Amer: 45 mL/min — ABNORMAL LOW (ref >90)
GFR calc non Af Amer: 38 mL/min — ABNORMAL LOW (ref >90)
Potassium: 4.3 mEq/L (ref 3.5–5.1)

## 2013-06-04 LAB — CBC WITH DIFFERENTIAL/PLATELET
Basophils Absolute: 0 10*3/uL (ref 0.0–0.1)
Basophils Relative: 0 % (ref 0–1)
Eosinophils Absolute: 0 10*3/uL (ref 0.0–0.7)
MCH: 30.5 pg (ref 26.0–34.0)
MCHC: 33.6 g/dL (ref 30.0–36.0)
Monocytes Relative: 4 % (ref 3–12)
Neutro Abs: 3.9 10*3/uL (ref 1.7–7.7)
Neutrophils Relative %: 59 % (ref 43–77)
RDW: 13.2 % (ref 11.5–15.5)

## 2013-06-04 LAB — URINALYSIS, ROUTINE W REFLEX MICROSCOPIC
Bilirubin Urine: NEGATIVE
Ketones, ur: NEGATIVE mg/dL
Leukocytes, UA: NEGATIVE
Nitrite: NEGATIVE
Specific Gravity, Urine: 1.01 (ref 1.005–1.030)
Urobilinogen, UA: 0.2 mg/dL (ref 0.0–1.0)
pH: 5.5 (ref 5.0–8.0)

## 2013-06-04 LAB — GLUCOSE, CAPILLARY: Glucose-Capillary: 476 mg/dL — ABNORMAL HIGH (ref 70–99)

## 2013-06-04 LAB — URINE MICROSCOPIC-ADD ON

## 2013-06-04 MED ORDER — SODIUM CHLORIDE 0.9 % IV SOLN
1000.0000 mL | INTRAVENOUS | Status: DC
  Administered 2013-06-04: 1000 mL via INTRAVENOUS

## 2013-06-04 MED ORDER — SODIUM CHLORIDE 0.9 % IV SOLN
1000.0000 mL | Freq: Once | INTRAVENOUS | Status: AC
  Administered 2013-06-04: 1000 mL via INTRAVENOUS

## 2013-06-04 NOTE — ED Provider Notes (Signed)
History    This chart was scribed for [name], MD, by Frederik Pear, ED scribe. The patient was seen in room APA14/APA14 and the patient's care was started at 2153.   CSN: [id number] Arrival date & time 06/04/13  2059  First MD Initiated Contact with Patient 06/04/13 2153     Chief Complaint  Patient presents with  . Hyperglycemia  . Dizziness  . Headache   (Consider location/radiation/quality/duration/timing/severity/associated sxs/prior Treatment) The history is provided by the patient and medical records. No language interpreter was used.    HPI Comments: Kylie Tate is a 72 y.o. female who presents to the Emergency Department complaining of worsening, constant hyperglycemia with associated polyuria, dizziness, and a HA that began 2 days ago. She reports she saw Dr. Jeanice Lim today who increased her Lantus from 35 to 40 units and her novolog from 15 to 25. She reports she is on a sliding scale for her novolog, but is unsure what the scale is and reports she now takes 25 units before each meal. She states she took her Lantus at 2100. She reports her appetite and fluid intake have been normal. Denies dysuria, cough, and fever.   She reports she lives alone since her husband had a CVA and is now in the [facility].  PCP is Dr. Jeanice Lim. Endocrinologist is Dr. Fransico Him  Past Medical History  Diagnosis Date  . Diabetes mellitus   . Hypertension   . GERD (gastroesophageal reflux disease)   . Hyperlipidemia   . Diverticulitis   . Thyroid disease     multinodular goiter  . Chronic pancreatitis   . Depression   . CRI (chronic renal insufficiency)   . Anemia, chronic disease   . Vertigo   . Diverticulosis    Past Surgical History  Procedure Laterality Date  . Nissen fundoplication    . Abdominal exploration surgery      abd pain-diverticulitis  . Colonoscopy  06/12/2000    Brodie-diffuse melanosis coli/mucousy exudate throughtout colon s/p bx (benign)  . Cholecystectomy   2001  . Colonoscopy with esophagogastroduodenoscopy (egd) N/A 03/10/2013    Procedure: COLONOSCOPY WITH ESOPHAGOGASTRODUODENOSCOPY (EGD);  Surgeon: West Bali, MD;  [facility];  Service: Endoscopy;  Laterality: N/A;  9:45  . Maloney dilation N/A 03/10/2013    Procedure: [name] DILATION;  Surgeon: West Bali, MD;  [facility];  Service: Endoscopy;  Laterality: N/A;  . Savory dilation N/A 03/10/2013    Procedure: SAVORY DILATION;  Surgeon: West Bali, MD;  [facility];  Service: Endoscopy;  Laterality: N/A;  . Colonoscopy N/A 04/26/2013    Procedure: COLONOSCOPY;  Surgeon: West Bali, MD;  [facility];  Service: Endoscopy;  Laterality: N/A;  9:30-rescheduled to 8:30 Pt aware of new time per Durward Mallard   Family History  Problem Relation Age of Onset  . Heart disease    . Arthritis    . Cancer Brother     prostate  . Diabetes    . Heart disease Mother   . Hypertension Mother   . Hyperlipidemia Mother   . Diabetes Mother   . Depression Father   . Diabetes Father   . Diabetes Sister   . Colon cancer Neg Hx   . Pancreatitis Neg Hx    History  Substance Use Topics  . Smoking status: Never Smoker   . Smokeless tobacco: Not on file  . Alcohol Use: No   OB History  Grav Para Term Preterm Abortions TAB SAB Ect Mult Living                 Review of Systems  Constitutional: Negative for fever.  Respiratory: Negative for cough.   Endocrine:       Hyperglycemia  Genitourinary: Negative for dysuria.       Polyuria  Neurological: Positive for dizziness and headaches.  All other systems reviewed and are negative.    Allergies  Review of patient's allergies indicates no known allergies.  Home Medications   Current Outpatient Rx  Name  Route  Sig  Dispense  Refill  . aspirin EC 81 MG tablet   Oral   Take 81 mg by mouth daily. For Blood Thinner         . atorvastatin (LIPITOR) 10 MG tablet   Oral   Take 10 mg by  mouth every morning. For Cholesterol         . diazepam (VALIUM) 5 MG tablet   Oral   Take 5 mg by mouth 3 (three) times daily as needed. NERVES or AS DIRECTED BY PHYSICIAN         . diltiazem (CARDIZEM CD) 180 MG 24 hr capsule   Oral   Take 180 mg by mouth every morning. For Blood Pressure         . gabapentin (NEURONTIN) 100 MG capsule   Oral   Take 100 mg by mouth 2 (two) times daily. Neuropathy         . imipramine (TOFRANIL) 10 MG tablet   Oral   Take 3 tablets (30 mg total) by mouth at bedtime. FOR SLEEP/MOOD or AS DIRECTED BY PHYSICIAN   90 tablet   2   . insulin aspart (NOVOLOG FLEXPEN) 100 UNIT/ML injection   Subcutaneous   Inject 25 Units into the skin 3 (three) times daily before meals.          . insulin detemir (LEVEMIR FLEXPEN) 100 UNIT/ML injection   Subcutaneous   Inject 25 Units into the skin 2 (two) times daily.          Marland Kitchen levothyroxine (SYNTHROID, LEVOTHROID) 50 MCG tablet   Oral   Take 50 mcg by mouth daily. For THYROID         . lipase/protease/amylase (CREON) 12000 UNITS CPEP   Oral   Take 2 capsules by mouth 3 (three) times daily before meals. FOR DIGESTION or AS DIRECTED BY PHYSICIAN         . losartan (COZAAR) 50 MG tablet   Oral   Take 50 mg by mouth daily.         . Multiple Vitamins-Minerals (CENTRUM PO)   Oral   Take 1 tablet by mouth daily.          Marland Kitchen omeprazole (PRILOSEC) 20 MG capsule   Oral   Take 20 mg by mouth daily. For Acid Reflux/GERD          BP 189/70  Pulse 80  Temp(Src) 97.7 F (36.5 C)  Resp 20  Ht 5\' 4"  (1.626 m)  Wt 216 lb (97.977 kg)  BMI 37.06 kg/m2 Physical Exam  Nursing note and vitals reviewed. Constitutional: She is oriented to person, place, and time. She appears well-developed and well-nourished.  HENT:  Head: Normocephalic and atraumatic.  Lips are slightly dry.  Eyes: Conjunctivae and EOM are normal. Pupils are equal, round, and reactive to light.  Neck: Normal range of motion  and phonation normal. Neck supple.  Cardiovascular: Normal rate, regular rhythm, normal heart sounds and intact distal pulses.  Exam reveals no gallop and no friction rub.   No murmur heard. Pulmonary/Chest: Effort normal and breath sounds normal. No respiratory distress. She has no wheezes. She has no rales. She exhibits no tenderness.  Abdominal: Soft. She exhibits no distension. There is no tenderness. There is no guarding.  Musculoskeletal: Normal range of motion.  Neurological: She is alert and oriented to person, place, and time. She has normal strength. She exhibits normal muscle tone.  Skin: Skin is warm and dry.  Psychiatric: She has a normal mood and affect. Her behavior is normal. Judgment and thought content normal.    ED Course  Procedures (including critical care time)  DIAGNOSTIC STUDIES: Patient Vitals for the past 24 hrs:  BP Temp Pulse Resp Height Weight  06/04/13 2105 189/70 mmHg 97.7 F (36.5 C) 80 20 5\' 4"  (1.626 m) 216 lb (97.977 kg)    COORDINATION OF CARE:  22:00- Discussed planned course of treatment with the patient, including IV fluids, urine culture, UA with reflex microscopic, and blood work, who is agreeable at this time.  Medications  0.9 %  sodium chloride infusion (0 mLs Intravenous Stopped 06/04/13 2316)    Followed by  0.9 %  sodium chloride infusion (0 mLs Intravenous Stopped 06/04/13 2327)    Followed by  0.9 %  sodium chloride infusion (1,000 mLs Intravenous New Bag/Given 06/04/13 2223)   Labs Reviewed  GLUCOSE, CAPILLARY - Abnormal; Notable for the following:    Glucose-Capillary 476 (*)    All other components within normal limits  CBC WITH DIFFERENTIAL - Abnormal; Notable for the following:    Hemoglobin 11.8 (*)    HCT 35.1 (*)    All other components within normal limits  BASIC METABOLIC PANEL - Abnormal; Notable for the following:    Sodium 130 (*)    Chloride 95 (*)    Glucose, Bld 501 (*)    BUN 36 (*)    Creatinine, Ser 1.36 (*)     GFR calc non Af Amer 38 (*)    GFR calc Af Amer 45 (*)    All other components within normal limits  URINALYSIS, ROUTINE W REFLEX MICROSCOPIC - Abnormal; Notable for the following:    Glucose, UA >1000 (*)    Hgb urine dipstick TRACE (*)    Protein, ur 30 (*)    All other components within normal limits  GLUCOSE, CAPILLARY - Abnormal; Notable for the following:    Glucose-Capillary 419 (*)    All other components within normal limits  GLUCOSE, CAPILLARY - Abnormal; Notable for the following:    Glucose-Capillary 383 (*)    All other components within normal limits  URINE CULTURE  URINE MICROSCOPIC-ADD ON    1. Hyperglycemia     MDM  Hyperglycemia without ketosis or suspected infection. She started a new insulin regimen, this evening, as directed by her endocrinologist. Doubt metabolic instability, serious bacterial infection or impending vascular collapse; the patient is stable for discharge.  Nursing Notes Reviewed/ Care Coordinated, and agree without changes. Applicable Imaging Reviewed.  Interpretation of Laboratory Data incorporated into ED treatment   Plan: Home Medications- usual; Home Treatments and Observation- Increase oral Fluids; return here if the recommended treatment, does not improve the symptoms; Recommended follow up- Endocrinology F/U in 3 days    I personally performed the services described in this documentation, which was scribed in my presence. The recorded information has been reviewed  and is accurate.     [name], MD 06/05/13 [id number]

## 2013-06-04 NOTE — ED Notes (Signed)
Pt c/o high blood sugar over 600's 2 days. Pt also c/o headache and dizziness.

## 2013-06-04 NOTE — ED Notes (Signed)
Pt is on sliding scale insulin 4x per day, has taken all her doses today, last dose was 25u with evening meal. Pt is also on long lasting insulin 40u at bedtime which she took at 2030 tonight. FS on arrival 476, pt co nausea that comes and goes.

## 2013-06-05 NOTE — ED Notes (Signed)
Discharge instructions reviewed with pt, questions answered. Pt verbalized understanding.  

## 2013-06-06 LAB — URINE CULTURE

## 2013-06-08 ENCOUNTER — Ambulatory Visit (INDEPENDENT_AMBULATORY_CARE_PROVIDER_SITE_OTHER): Payer: Medicare Other | Admitting: Family Medicine

## 2013-06-08 VITALS — BP 100/80 | HR 70 | Temp 97.2°F | Resp 20 | Wt 167.0 lb

## 2013-06-08 DIAGNOSIS — N183 Chronic kidney disease, stage 3 unspecified: Secondary | ICD-10-CM | POA: Insufficient documentation

## 2013-06-08 DIAGNOSIS — I10 Essential (primary) hypertension: Secondary | ICD-10-CM

## 2013-06-08 DIAGNOSIS — E119 Type 2 diabetes mellitus without complications: Secondary | ICD-10-CM

## 2013-06-08 DIAGNOSIS — R413 Other amnesia: Secondary | ICD-10-CM

## 2013-06-08 LAB — BASIC METABOLIC PANEL
CO2: 23 mEq/L (ref 19–32)
Chloride: 99 mEq/L (ref 96–112)
Glucose, Bld: 531 mg/dL (ref 70–99)
Potassium: 5.1 mEq/L (ref 3.5–5.3)
Sodium: 138 mEq/L (ref 135–145)

## 2013-06-08 LAB — GLUCOSE, FINGERSTICK (STAT): Glucose, fingerstick: >444 mg/dL — ABNORMAL HIGH (ref 70–99)

## 2013-06-08 NOTE — Assessment & Plan Note (Signed)
BP normal today, severely elevated in ED, no change to meds

## 2013-06-08 NOTE — Assessment & Plan Note (Signed)
She may have some underlying dementia, unable to do MMSE today, will plan for this at next visit with myself, unless she is put in SNF first.

## 2013-06-08 NOTE — Assessment & Plan Note (Signed)
Repeat creatinine shows CKD a little worse than baseline, increase water intake, need to decrease CBG before further damage

## 2013-06-08 NOTE — Progress Notes (Signed)
  Subjective:    Patient ID: Kylie Tate, female    DOB: [birth year], 72 y.o.   MRN: [id number]  HPI  Kylie Tate is here to followup emergency room visit for hyperglycemia. She was seen in the ED secondary to elevated blood sugar her blood sugar was 600 per report at home. In the emergency room the highest blood sugar was 476. She had her insulin titrated by her endocrinologist Dr. Fransico Him. She's supposed to be taking Levemir 40 units and questionable NovoLog 25 units 3 times a day. She states that she did get the insulin mixed up over the weekend. She is also being followed by [facility],  Blood sugars are still running high she did not bring her meter states that she has to at home but they both have been running high. States her blood sugar this morning fasting was 601 and she took her morning insulin. She admits to headache but denies nausea vomiting dizziness. She denies any chest pain shortness of breath. Labs were reviewed from the ER visit  She also fell 2 weeks ago, scraping both knees, denies any pain or swelling  Review of Systems  GEN- denies fatigue, fever, weight loss,weakness, recent illness HEENT- denies eye drainage, change in vision, nasal discharge, CVS- denies chest pain, palpitations RESP- denies SOB, cough, wheeze ABD- denies N/V, change in stools, abd pain GU- denies dysuria, hematuria, dribbling, incontinence MSK- denies joint pain, muscle aches, injury Neuro- + headache, dizziness, syncope, seizure activity      Objective:   Physical Exam GEN- NAD, alert and oriented x3 HEENT- PERRL, EOMI, non injected sclera, pink conjunctiva, MMM, oropharynx clear Neck- Supple,  CVS- RRR,  1/6 SEM RESP-CTAB MSK- Bilat knee- normal inspection, no effusion, fair ROM. Skin- abrasion right lower knee, no erythema EXT- No edema Pulses- Radial, DP- 2+        Assessment & Plan:

## 2013-06-08 NOTE — Patient Instructions (Addendum)
Increase insulin to Levemir 50 units at bedtime Take Novolog 25 units three times a day  F/U With me in office on Thursday for sugars, bring your meter-- Schedule  9:45 Thursday with Shon Hale  F/U with Dr. Fransico Him in 2 weeks

## 2013-06-08 NOTE — Assessment & Plan Note (Signed)
It seems that she has been uncontrolled for the past few weeks secondary to her husband being in a nursing Center and she is now home alone and sending for herself. In the office today she was given 10 units of NovoLog her sugar still cannot be read on the meter but the lab work showed that she was in the 500s. I discussed this with her endocrinologist as she was otherwise stable we have adjusted her medications. She will increase her Levemir to 50 units she will take 25 units of NovoLog with each meal. It appears compliance is the main issue here.   [facility] will followup with patient tomorrow I discussed this with her coordinator. They will call the office with her CBG readings from yesterday. I will have her return to the office on Thursday to be evaluated again by her physician assistant she is to bring her meter as well as her other medications to make sure she is taking the right things. Of note there's been some concern she was taking glucose tablets over the weekend thinking she was supposed to use these to bring her sugar down. She is decompensated quite a bit with her husband being in a nursing facility a meeting will be called with her family members to discuss further placement. She is open to an assisted-living or nursing facility and understands that she's having difficulty caring for herself. For now she has a neighbor who is watching her pretty much around-the-clock and making sure that she gets her medications she was with her at the visit today and will come back with her on Thursday.  If she has any deteriorating symptoms of hyperglykemia would send to ED

## 2013-06-09 ENCOUNTER — Emergency Department (HOSPITAL_COMMUNITY): Payer: Medicare Other

## 2013-06-09 ENCOUNTER — Encounter (HOSPITAL_COMMUNITY): Payer: Self-pay | Admitting: Emergency Medicine

## 2013-06-09 ENCOUNTER — Inpatient Hospital Stay (HOSPITAL_COMMUNITY)
Admission: EM | Admit: 2013-06-09 | Discharge: 2013-06-12 | DRG: 638 | Disposition: A | Discharge status: Home Health | Payer: Medicare Other | Attending: Family Medicine | Admitting: Family Medicine

## 2013-06-09 DIAGNOSIS — N179 Acute kidney failure, unspecified: Secondary | ICD-10-CM | POA: Diagnosis present

## 2013-06-09 DIAGNOSIS — F329 Major depressive disorder, single episode, unspecified: Secondary | ICD-10-CM | POA: Diagnosis present

## 2013-06-09 DIAGNOSIS — N183 Chronic kidney disease, stage 3 unspecified: Secondary | ICD-10-CM | POA: Diagnosis present

## 2013-06-09 DIAGNOSIS — Z9181 History of falling: Secondary | ICD-10-CM

## 2013-06-09 DIAGNOSIS — D638 Anemia in other chronic diseases classified elsewhere: Secondary | ICD-10-CM | POA: Diagnosis present

## 2013-06-09 DIAGNOSIS — K219 Gastro-esophageal reflux disease without esophagitis: Secondary | ICD-10-CM | POA: Diagnosis present

## 2013-06-09 DIAGNOSIS — Z9119 Patient's noncompliance with other medical treatment and regimen: Secondary | ICD-10-CM

## 2013-06-09 DIAGNOSIS — I129 Hypertensive chronic kidney disease with stage 1 through stage 4 chronic kidney disease, or unspecified chronic kidney disease: Secondary | ICD-10-CM | POA: Diagnosis present

## 2013-06-09 DIAGNOSIS — IMO0001 Reserved for inherently not codable concepts without codable children: Principal | ICD-10-CM | POA: Diagnosis present

## 2013-06-09 DIAGNOSIS — E785 Hyperlipidemia, unspecified: Secondary | ICD-10-CM | POA: Diagnosis present

## 2013-06-09 DIAGNOSIS — E669 Obesity, unspecified: Secondary | ICD-10-CM | POA: Diagnosis present

## 2013-06-09 DIAGNOSIS — E119 Type 2 diabetes mellitus without complications: Secondary | ICD-10-CM

## 2013-06-09 DIAGNOSIS — E860 Dehydration: Secondary | ICD-10-CM | POA: Diagnosis present

## 2013-06-09 DIAGNOSIS — Z794 Long term (current) use of insulin: Secondary | ICD-10-CM

## 2013-06-09 DIAGNOSIS — N189 Chronic kidney disease, unspecified: Secondary | ICD-10-CM | POA: Diagnosis present

## 2013-06-09 DIAGNOSIS — Z91199 Patient's noncompliance with other medical treatment and regimen due to unspecified reason: Secondary | ICD-10-CM

## 2013-06-09 DIAGNOSIS — Z6828 Body mass index (BMI) 28.0-28.9, adult: Secondary | ICD-10-CM

## 2013-06-09 DIAGNOSIS — E871 Hypo-osmolality and hyponatremia: Secondary | ICD-10-CM | POA: Diagnosis present

## 2013-06-09 DIAGNOSIS — E042 Nontoxic multinodular goiter: Secondary | ICD-10-CM | POA: Diagnosis present

## 2013-06-09 DIAGNOSIS — K861 Other chronic pancreatitis: Secondary | ICD-10-CM | POA: Diagnosis present

## 2013-06-09 DIAGNOSIS — R739 Hyperglycemia, unspecified: Secondary | ICD-10-CM | POA: Diagnosis present

## 2013-06-09 DIAGNOSIS — R7309 Other abnormal glucose: Secondary | ICD-10-CM

## 2013-06-09 DIAGNOSIS — F3289 Other specified depressive episodes: Secondary | ICD-10-CM | POA: Diagnosis present

## 2013-06-09 DIAGNOSIS — Z79899 Other long term (current) drug therapy: Secondary | ICD-10-CM

## 2013-06-09 DIAGNOSIS — I10 Essential (primary) hypertension: Secondary | ICD-10-CM | POA: Diagnosis present

## 2013-06-09 DIAGNOSIS — E039 Hypothyroidism, unspecified: Secondary | ICD-10-CM | POA: Diagnosis present

## 2013-06-09 LAB — CBC WITH DIFFERENTIAL/PLATELET
Eosinophils Relative: 0 % (ref 0–5)
Lymphocytes Relative: 28 % (ref 12–46)
Lymphs Abs: 1.9 10*3/uL (ref 0.7–4.0)
MCV: 92.1 fL (ref 78.0–100.0)
Platelets: 246 10*3/uL (ref 150–400)
RBC: 3.79 MIL/uL — ABNORMAL LOW (ref 3.87–5.11)
WBC: 6.9 10*3/uL (ref 4.0–10.5)

## 2013-06-09 LAB — URINALYSIS, ROUTINE W REFLEX MICROSCOPIC
Bilirubin Urine: NEGATIVE
Glucose, UA: >1000 mg/dL — AB
Hgb urine dipstick: NEGATIVE
Ketones, ur: NEGATIVE mg/dL
Protein, ur: 30 mg/dL — AB

## 2013-06-09 LAB — HEPATIC FUNCTION PANEL
ALT: 22 U/L (ref 0–35)
AST: 22 U/L (ref 0–37)
Albumin: 3.5 g/dL (ref 3.5–5.2)
Alkaline Phosphatase: 156 U/L — ABNORMAL HIGH (ref 39–117)
Total Bilirubin: 0.3 mg/dL (ref 0.3–1.2)

## 2013-06-09 LAB — LIPID PANEL
LDL Cholesterol: UNDETERMINED mg/dL (ref 0–99)
Triglycerides: 782 mg/dL — ABNORMAL HIGH (ref <150)
VLDL: UNDETERMINED mg/dL (ref 0–40)

## 2013-06-09 LAB — GLUCOSE, CAPILLARY
Glucose-Capillary: 433 mg/dL — ABNORMAL HIGH (ref 70–99)
Glucose-Capillary: 323 mg/dL — ABNORMAL HIGH (ref 70–99)
Glucose-Capillary: 238 mg/dL — ABNORMAL HIGH (ref 70–99)
Glucose-Capillary: 157 mg/dL — ABNORMAL HIGH (ref 70–99)
Glucose-Capillary: 168 mg/dL — ABNORMAL HIGH (ref 70–99)
Glucose-Capillary: 147 mg/dL — ABNORMAL HIGH (ref 70–99)

## 2013-06-09 LAB — URINE MICROSCOPIC-ADD ON

## 2013-06-09 LAB — BASIC METABOLIC PANEL
CO2: 23 mEq/L (ref 19–32)
Calcium: 9.6 mg/dL (ref 8.4–10.5)
Glucose, Bld: 705 mg/dL (ref 70–99)
Potassium: 4.8 mEq/L (ref 3.5–5.1)
Sodium: 132 mEq/L — ABNORMAL LOW (ref 135–145)

## 2013-06-09 MED ORDER — SODIUM CHLORIDE 0.9 % IV SOLN
INTRAVENOUS | Status: DC
  Administered 2013-06-09: 3 [IU]/h via INTRAVENOUS
  Filled 2013-06-09 (×2): qty 1

## 2013-06-09 MED ORDER — ASPIRIN EC 81 MG PO TBEC
81.0000 mg | DELAYED_RELEASE_TABLET | Freq: Every day | ORAL | Status: DC
  Administered 2013-06-09 – 2013-06-12 (×4): 81 mg via ORAL
  Filled 2013-06-09 (×4): qty 1

## 2013-06-09 MED ORDER — DEXTROSE 50 % IV SOLN: 25.0000 mL | INTRAVENOUS | Status: DC | PRN

## 2013-06-09 MED ORDER — HEPARIN SODIUM (PORCINE) 5000 UNIT/ML IJ SOLN
5000.0000 [IU] | Freq: Three times a day (TID) | INTRAMUSCULAR | Status: DC
  Administered 2013-06-09 – 2013-06-12 (×9): 5000 [IU] via SUBCUTANEOUS
  Filled 2013-06-09 (×9): qty 1

## 2013-06-09 MED ORDER — ONDANSETRON HCL 4 MG/2ML IJ SOLN: 4.0000 mg | Freq: Four times a day (QID) | INTRAMUSCULAR | Status: DC | PRN

## 2013-06-09 MED ORDER — DEXTROSE-NACL 5-0.45 % IV SOLN
INTRAVENOUS | Status: DC
  Administered 2013-06-09: 19:00:00 via INTRAVENOUS

## 2013-06-09 MED ORDER — SODIUM CHLORIDE 0.9 % IV BOLUS (SEPSIS): 500.0000 mL | Freq: Once | INTRAVENOUS | Status: DC

## 2013-06-09 MED ORDER — INSULIN REGULAR BOLUS VIA INFUSION
0.0000 [IU] | Freq: Three times a day (TID) | INTRAVENOUS | Status: DC
  Administered 2013-06-09: 10 [IU] via INTRAVENOUS
  Filled 2013-06-09: qty 10

## 2013-06-09 MED ORDER — PANCRELIPASE (LIP-PROT-AMYL) 12000-38000 UNITS PO CPEP
2.0000 | ORAL_CAPSULE | Freq: Three times a day (TID) | ORAL | Status: DC
  Administered 2013-06-09 – 2013-06-12 (×9): 2 via ORAL
  Filled 2013-06-09 (×4): qty 2
  Filled 2013-06-09 (×2): qty 1
  Filled 2013-06-09 (×2): qty 2
  Filled 2013-06-09 (×2): qty 1
  Filled 2013-06-09: qty 2

## 2013-06-09 MED ORDER — ACETAMINOPHEN 650 MG RE SUPP: 650.0000 mg | Freq: Four times a day (QID) | RECTAL | Status: DC | PRN

## 2013-06-09 MED ORDER — SODIUM CHLORIDE 0.9 % IV SOLN: INTRAVENOUS | Status: DC

## 2013-06-09 MED ORDER — SENNA 8.6 MG PO TABS
1.0000 | ORAL_TABLET | Freq: Two times a day (BID) | ORAL | Status: DC
  Administered 2013-06-09 – 2013-06-12 (×5): 8.6 mg via ORAL
  Filled 2013-06-09 (×6): qty 1

## 2013-06-09 MED ORDER — IMIPRAMINE HCL 10 MG PO TABS
30.0000 mg | ORAL_TABLET | Freq: Every day | ORAL | Status: DC
  Administered 2013-06-09 – 2013-06-11 (×3): 30 mg via ORAL
  Filled 2013-06-09 (×5): qty 3

## 2013-06-09 MED ORDER — GABAPENTIN 100 MG PO CAPS
100.0000 mg | ORAL_CAPSULE | Freq: Two times a day (BID) | ORAL | Status: DC
  Administered 2013-06-09 – 2013-06-12 (×7): 100 mg via ORAL
  Filled 2013-06-09 (×7): qty 1

## 2013-06-09 MED ORDER — LOSARTAN POTASSIUM 50 MG PO TABS: 50.0000 mg | ORAL_TABLET | Freq: Every day | ORAL | Status: DC

## 2013-06-09 MED ORDER — INSULIN ASPART 100 UNIT/ML IV SOLN
10.0000 [IU] | Freq: Once | INTRAVENOUS | Status: AC
  Administered 2013-06-09: 10 [IU] via INTRAVENOUS

## 2013-06-09 MED ORDER — ONDANSETRON HCL 4 MG PO TABS: 4.0000 mg | ORAL_TABLET | Freq: Four times a day (QID) | ORAL | Status: DC | PRN

## 2013-06-09 MED ORDER — ACETAMINOPHEN 325 MG PO TABS: 650.0000 mg | ORAL_TABLET | Freq: Four times a day (QID) | ORAL | Status: DC | PRN

## 2013-06-09 MED ORDER — SODIUM CHLORIDE 0.9 % IV BOLUS (SEPSIS)
500.0000 mL | Freq: Once | INTRAVENOUS | Status: AC
  Administered 2013-06-09: 500 mL via INTRAVENOUS

## 2013-06-09 MED ORDER — DIAZEPAM 5 MG PO TABS
5.0000 mg | ORAL_TABLET | Freq: Three times a day (TID) | ORAL | Status: DC
  Administered 2013-06-09 – 2013-06-12 (×9): 5 mg via ORAL
  Filled 2013-06-09 (×9): qty 1

## 2013-06-09 MED ORDER — DEXTROSE-NACL 5-0.45 % IV SOLN
INTRAVENOUS | Status: DC
  Administered 2013-06-10: 08:00:00 via INTRAVENOUS

## 2013-06-09 MED ORDER — FAMOTIDINE 20 MG PO TABS: 10.0000 mg | ORAL_TABLET | Freq: Every day | ORAL | Status: DC

## 2013-06-09 MED ORDER — NABUMETONE 500 MG PO TABS
500.0000 mg | ORAL_TABLET | Freq: Two times a day (BID) | ORAL | Status: DC
  Administered 2013-06-09: 500 mg via ORAL
  Filled 2013-06-09: qty 1

## 2013-06-09 MED ORDER — LEVOTHYROXINE SODIUM 25 MCG PO TABS
50.0000 ug | ORAL_TABLET | Freq: Every day | ORAL | Status: DC
  Administered 2013-06-10 – 2013-06-12 (×3): 50 ug via ORAL
  Filled 2013-06-09 (×3): qty 2

## 2013-06-09 MED ORDER — ATORVASTATIN CALCIUM 10 MG PO TABS
10.0000 mg | ORAL_TABLET | Freq: Every morning | ORAL | Status: DC
  Administered 2013-06-10 – 2013-06-12 (×3): 10 mg via ORAL
  Filled 2013-06-09 (×3): qty 1

## 2013-06-09 MED ORDER — DILTIAZEM HCL ER COATED BEADS 180 MG PO CP24
180.0000 mg | ORAL_CAPSULE | Freq: Every morning | ORAL | Status: DC
  Administered 2013-06-10 – 2013-06-12 (×3): 180 mg via ORAL
  Filled 2013-06-09 (×3): qty 1

## 2013-06-09 MED ORDER — BISACODYL 10 MG RE SUPP: 10.0000 mg | Freq: Every day | RECTAL | Status: DC | PRN

## 2013-06-09 MED ORDER — PANTOPRAZOLE SODIUM 40 MG PO TBEC
40.0000 mg | DELAYED_RELEASE_TABLET | Freq: Every day | ORAL | Status: DC
  Administered 2013-06-09 – 2013-06-12 (×4): 40 mg via ORAL
  Filled 2013-06-09 (×4): qty 1

## 2013-06-09 NOTE — ED Provider Notes (Addendum)
History  This chart was scribed for Kylie Jakes, MD by Bennett Scrape, ED Scribe. This patient was seen in room APA08/APA08 and the patient's care was started at 12:00 PM.  CSN: [id number] Arrival date & time 06/09/13  1112  First MD Initiated Contact with Patient 06/09/13 1142     Chief Complaint  Patient presents with  . Hyperglycemia    Patient is a 72 y.o. female presenting with hyperglycemia. The history is provided by the patient. No language interpreter was used.  Hyperglycemia Blood sugar level PTA:  >600 Severity:  Severe Duration:  2 weeks Timing:  Constant Progression:  Unchanged Current diabetic therapy:  Novolog and Lantus Associated symptoms: increased thirst and polyuria   Associated symptoms: no abdominal pain, no chest pain, no confusion, no dysuria, no fever, no nausea, no shortness of breath and no vomiting     HPI Comments: Kylie Tate is a 72 y.o. female with a h/o DM for the past 30 years who presents to the Emergency Department complaining of 2 weeks of hyperglycemia. She takes Novolog and Lantus for DM.  Pt was seen on 06/04/13 for a CBG over 600 after upping her Lantus from 35 to 40 units and her novolog from 15 to 25 per endocrinologist instructions. She was discharged in stable condition with instructions to follow up with her endocrinologist. CBGs have been ranging between 300 to 600 consistently since discharge. She was seen by a representative of Dr. Deirdre Peer office today and was advised to come to the ED. She states that she has been having a person from Dr. Deirdre Peer office come to her home every other week for the past 6 months and denies having ongoing issues with her CBGs. She reports one prior episode of hyperglycemia when she was switched to Lantus last year. She does not remember the medication she was switched off from. She states that she checks her CBGs 5 times daily and it was 330 last night. She was seen by Dr. Jeanice Lim yesterday and had  blood work taken but denies knowing the CBG at that time. Pt denies any recent changes in diet. She denies any new medications. She reports polydipsia and polyuria as associated symptoms but denies SOB and CP.  PCP is Dr. Jeanice Lim Lives alone. Husband has been at the [facility] for the past 6 months for a CVA.  Past Medical History  Diagnosis Date  . Diabetes mellitus   . Hypertension   . GERD (gastroesophageal reflux disease)   . Hyperlipidemia   . Diverticulitis   . Thyroid disease     multinodular goiter  . Chronic pancreatitis   . Depression   . CRI (chronic renal insufficiency)   . Anemia, chronic disease   . Vertigo   . Diverticulosis    Past Surgical History  Procedure Laterality Date  . Nissen fundoplication    . Abdominal exploration surgery      abd pain-diverticulitis  . Colonoscopy  06/12/2000    Brodie-diffuse melanosis coli/mucousy exudate throughtout colon s/p bx (benign)  . Cholecystectomy  2001  . Colonoscopy with esophagogastroduodenoscopy (egd) N/A 03/10/2013    Procedure: COLONOSCOPY WITH ESOPHAGOGASTRODUODENOSCOPY (EGD);  Surgeon: West Bali, MD;  [facility];  Service: Endoscopy;  Laterality: N/A;  9:45  . Maloney dilation N/A 03/10/2013    Procedure: [name] DILATION;  Surgeon: West Bali, MD;  Location: [facility];  Service: Endoscopy;  Laterality: N/A;  . Savory dilation N/A 03/10/2013    Procedure:  SAVORY DILATION;  Surgeon: West Bali, MD;  Location: [facility];  Service: Endoscopy;  Laterality: N/A;  . Colonoscopy N/A 04/26/2013    Procedure: COLONOSCOPY;  Surgeon: West Bali, MD;  Location: [facility];  Service: Endoscopy;  Laterality: N/A;  9:30-rescheduled to 8:30 Pt aware of new time per Durward Mallard   Family History  Problem Relation Age of Onset  . Heart disease    . Arthritis    . Cancer Brother     prostate  . Diabetes    . Heart disease Mother   . Hypertension Mother   . Hyperlipidemia Mother   . Diabetes  Mother   . Depression Father   . Diabetes Father   . Diabetes Sister   . Colon cancer Neg Hx   . Pancreatitis Neg Hx    History  Substance Use Topics  . Smoking status: Never Smoker   . Smokeless tobacco: Not on file  . Alcohol Use: No   No OB history provided.  Review of Systems  Constitutional: Negative for fever and chills.  HENT: Negative for congestion, sore throat and neck pain.   Eyes: Negative for visual disturbance.  Respiratory: Negative for cough and shortness of breath.   Cardiovascular: Negative for chest pain and leg swelling.  Gastrointestinal: Negative for nausea, vomiting, abdominal pain and diarrhea.  Endocrine: Positive for polydipsia and polyuria.  Genitourinary: Negative for dysuria.  Musculoskeletal: Negative for back pain.  Skin: Negative for rash.  Neurological: Negative for headaches.  Hematological: Does not bruise/bleed easily.  Psychiatric/Behavioral: Negative for confusion.    Allergies  Review of patient's allergies indicates no known allergies.  Home Medications   Current Outpatient Rx  Name  Route  Sig  Dispense  Refill  . aspirin EC 81 MG tablet   Oral   Take 81 mg by mouth daily. For Blood Thinner         . atorvastatin (LIPITOR) 10 MG tablet   Oral   Take 10 mg by mouth every morning. For Cholesterol         . diazepam (VALIUM) 5 MG tablet   Oral   Take 5 mg by mouth 3 (three) times daily.         Marland Kitchen diltiazem (CARDIZEM CD) 180 MG 24 hr capsule   Oral   Take 180 mg by mouth every morning. For Blood Pressure         . fish oil-omega-3 fatty acids 1000 MG capsule   Oral   Take 1 g by mouth daily.         Marland Kitchen gabapentin (NEURONTIN) 100 MG capsule   Oral   Take 100 mg by mouth 2 (two) times daily. Neuropathy         . imipramine (TOFRANIL) 10 MG tablet   Oral   Take 30 mg by mouth at bedtime.         . insulin aspart (NOVOLOG FLEXPEN) 100 UNIT/ML injection   Subcutaneous   Inject 25 Units into the skin 3  (three) times daily before meals.          . insulin detemir (LEVEMIR FLEXPEN) 100 UNIT/ML injection   Subcutaneous   Inject 50 Units into the skin at bedtime.          Marland Kitchen levothyroxine (SYNTHROID, LEVOTHROID) 50 MCG tablet   Oral   Take 50 mcg by mouth daily. For THYROID         . lipase/protease/amylase (CREON) 12000 UNITS CPEP  Oral   Take 2 capsules by mouth 3 (three) times daily. Takes 2 capsules with meals and 1 with snacks.         Marland Kitchen losartan (COZAAR) 50 MG tablet   Oral   Take 50 mg by mouth daily.         . Multiple Vitamins-Minerals (CENTRUM PO)   Oral   Take 1 tablet by mouth daily.          . nabumetone (RELAFEN) 500 MG tablet   Oral   Take 500 mg by mouth 2 (two) times daily.         Marland Kitchen omeprazole (PRILOSEC) 20 MG capsule   Oral   Take 20 mg by mouth daily. For Acid Reflux/GERD         . ranitidine (ZANTAC) 150 MG tablet   Oral   Take 150 mg by mouth daily.          Triage Vitals: BP 162/73  Pulse 103  Temp(Src) 98 F (36.7 C) (Oral)  Resp 24  Ht 5\' 6"  (1.676 m)  Wt 174 lb (78.926 kg)  BMI 28.1 kg/m2  SpO2 97%  Physical Exam  Nursing note and vitals reviewed. Constitutional: She is oriented to person, place, and time. She appears well-developed and well-nourished. No distress.  HENT:  Head: Normocephalic and atraumatic.  Dry MM  Eyes: Conjunctivae and EOM are normal. Pupils are equal, round, and reactive to light.  Sclera are clear  Neck: Neck supple. No tracheal deviation present.  Cardiovascular: Normal rate and regular rhythm.   No murmur heard. Pulses:      Dorsalis pedis pulses are 2+ on the right side, and 2+ on the left side.  Pulmonary/Chest: Effort normal and breath sounds normal. No respiratory distress. She has no wheezes.  Abdominal: Soft. Bowel sounds are normal. She exhibits no distension. There is no tenderness.  Musculoskeletal: Normal range of motion. She exhibits no edema (no ankle swelling).   Lymphadenopathy:    She has no cervical adenopathy.  Neurological: She is alert and oriented to person, place, and time. No cranial nerve deficit.  Pt able to move both sets of fingers and toes  Skin: Skin is warm and dry. No rash noted.  Psychiatric: She has a normal mood and affect. Her behavior is normal.    ED Course  Procedures (including critical care time)  Medications  0.9 %  sodium chloride infusion ( Intravenous Restarted 06/09/13 1328)  sodium chloride 0.9 % bolus 500 mL (0 mLs Intravenous Stopped 06/09/13 1328)  insulin regular (NOVOLIN R,HUMULIN R) 1 Units/mL in sodium chloride 0.9 % 100 mL infusion (not administered)  sodium chloride 0.9 % bolus 500 mL (0 mLs Intravenous Stopped 06/09/13 1230)  insulin aspart (novoLOG) injection 10 Units (10 Units Intravenous Given 06/09/13 1248)    DIAGNOSTIC STUDIES: Oxygen Saturation is 97% on room air, normal by my interpretation.    COORDINATION OF CARE: 12:27 PM-Discussed treatment plan which includes medications, CBC panel, BMP and glucose with pt at bedside and pt agreed to plan.    Labs Reviewed  GLUCOSE, CAPILLARY - Abnormal; Notable for the following:    Glucose-Capillary >600 (*)    All other components within normal limits  CBC WITH DIFFERENTIAL - Abnormal; Notable for the following:    RBC 3.79 (*)    Hemoglobin 11.6 (*)    HCT 34.9 (*)    All other components within normal limits  BASIC METABOLIC PANEL - Abnormal; Notable for the following:  Sodium 132 (*)    Glucose, Bld 705 (*)    BUN 42 (*)    Creatinine, Ser 1.46 (*)    GFR calc non Af Amer 35 (*)    GFR calc Af Amer 41 (*)    All other components within normal limits  HEPATIC FUNCTION PANEL - Abnormal; Notable for the following:    Alkaline Phosphatase 156 (*)    All other components within normal limits  URINALYSIS, ROUTINE W REFLEX MICROSCOPIC - Abnormal; Notable for the following:    Glucose, UA >1000 (*)    Protein, ur 30 (*)    All other  components within normal limits  URINE MICROSCOPIC-ADD ON - Abnormal; Notable for the following:    Squamous Epithelial / LPF FEW (*)    Bacteria, UA MANY (*)    All other components within normal limits  GLUCOSE, CAPILLARY - Abnormal; Notable for the following:    Glucose-Capillary 433 (*)    All other components within normal limits   Dg Chest 2 View  06/09/2013   *RADIOLOGY REPORT*  Clinical Data: Hyperglycemia.  CHEST - 2 VIEW  Comparison: CT and plain film chest 09/22/2010.  Findings: The lungs are clear.  Heart size is normal.  No pneumothorax or pleural effusion.  IMPRESSION: No acute disease.   Original Report Authenticated By: Holley Dexter, M.D.   Results for orders placed during the hospital encounter of 06/09/13  GLUCOSE, CAPILLARY      Result Value Range   Glucose-Capillary >600 (*) 70 - 99 mg/dL  CBC WITH DIFFERENTIAL      Result Value Range   WBC 6.9  4.0 - 10.5 K/uL   RBC 3.79 (*) 3.87 - 5.11 MIL/uL   Hemoglobin 11.6 (*) 12.0 - 15.0 g/dL   HCT 16.1 (*) 09.6 - 04.5 %   MCV 92.1  78.0 - 100.0 fL   MCH 30.6  26.0 - 34.0 pg   MCHC 33.2  30.0 - 36.0 g/dL   RDW 40.9  81.1 - 91.4 %   Platelets 246  150 - 400 K/uL   Neutrophils Relative % 67  43 - 77 %   Neutro Abs 4.7  1.7 - 7.7 K/uL   Lymphocytes Relative 28  12 - 46 %   Lymphs Abs 1.9  0.7 - 4.0 K/uL   Monocytes Relative 4  3 - 12 %   Monocytes Absolute 0.3  0.1 - 1.0 K/uL   Eosinophils Relative 0  0 - 5 %   Eosinophils Absolute 0.0  0.0 - 0.7 K/uL   Basophils Relative 0  0 - 1 %   Basophils Absolute 0.0  0.0 - 0.1 K/uL  BASIC METABOLIC PANEL      Result Value Range   Sodium 132 (*) 135 - 145 mEq/L   Potassium 4.8  3.5 - 5.1 mEq/L   Chloride 97  96 - 112 mEq/L   CO2 23  19 - 32 mEq/L   Glucose, Bld 705 (*) 70 - 99 mg/dL   BUN 42 (*) 6 - 23 mg/dL   Creatinine, Ser 7.82 (*) 0.50 - 1.10 mg/dL   Calcium 9.6  8.4 - 95.6 mg/dL   GFR calc non Af Amer 35 (*) >90 mL/min   GFR calc Af Amer 41 (*) >90 mL/min   HEPATIC FUNCTION PANEL      Result Value Range   Total Protein 7.8  6.0 - 8.3 g/dL   Albumin 3.5  3.5 - 5.2 g/dL   AST  22  0 - 37 U/L   ALT 22  0 - 35 U/L   Alkaline Phosphatase 156 (*) 39 - 117 U/L   Total Bilirubin 0.3  0.3 - 1.2 mg/dL   Bilirubin, Direct 0.1  0.0 - 0.3 mg/dL   Indirect Bilirubin 0.2 (*) 0.3 - 0.9 mg/dL  URINALYSIS, ROUTINE W REFLEX MICROSCOPIC      Result Value Range   Color, Urine YELLOW  YELLOW   APPearance CLEAR  CLEAR   Specific Gravity, Urine 1.010  1.005 - 1.030   pH 5.5  5.0 - 8.0   Glucose, UA >1000 (*) NEGATIVE mg/dL   Hgb urine dipstick NEGATIVE  NEGATIVE   Bilirubin Urine NEGATIVE  NEGATIVE   Ketones, ur NEGATIVE  NEGATIVE mg/dL   Protein, ur 30 (*) NEGATIVE mg/dL   Urobilinogen, UA 0.2  0.0 - 1.0 mg/dL   Nitrite NEGATIVE  NEGATIVE   Leukocytes, UA NEGATIVE  NEGATIVE  URINE MICROSCOPIC-ADD ON      Result Value Range   Squamous Epithelial / LPF FEW (*) RARE   WBC, UA 0-2  <3 WBC/hpf   RBC / HPF 0-2  <3 RBC/hpf   Bacteria, UA MANY (*) RARE  GLUCOSE, CAPILLARY      Result Value Range   Glucose-Capillary 433 (*) 70 - 99 mg/dL     Date: 16/08/9603  Rate: 86  Rhythm: normal sinus rhythm  QRS Axis: left  Intervals: normal  ST/T Wave abnormalities: normal  Conduction Disutrbances:none  Narrative Interpretation:   Old EKG Reviewed: unchanged No significant change in EKG compared to 06/05/2012    CRITICAL CARE Performed by: Kylie Tate. Total critical care time: 30 Critical care time was exclusive of separately billable procedures and treating other patients. Critical care was necessary to treat or prevent imminent or life-threatening deterioration. Critical care was time spent personally by me on the following activities: development of treatment plan with patient and/or surrogate as well as nursing, discussions with consultants, evaluation of patient's response to treatment, examination of patient, obtaining history from patient or  surrogate, ordering and performing treatments and interventions, ordering and review of laboratory studies, ordering and review of radiographic studies, pulse oximetry and re-evaluation of patient's condition.   1. Hyperglycemia   2. Dehydration     MDM  Patient with marked hyperglycemia. Blood sugars have been in the 400-600 range for several days. Patient was seen on Friday for same had some adjustments done on her insulin blood sugar still high. Has a home nurse coming out. Patient most likely needs admission to stabilize the blood sugars. Blood sugar originally and 700s here given 10 units of IV insulin blood sugar down to 400 glucose stabilizer drip started. Patient receiving IV hydration total of 1 L. Chest x-rays negative for any pneumonia or permanent edema. No significant electrolyte abnormalities other than pseudohyponatremia due to the high sugar no evidence of acidosis. BUN elevated at 42 creatinine elevated [place] 1.46 potassium improvement on her normal creatinines patient may have some dehydration clinically does appear dry. We'll discuss with hospitalist about admission.  I personally performed the services described in this documentation, which was scribed in my presence. The recorded information has been reviewed and is accurate.     Kylie Jakes, MD 06/09/13 5409  Kylie Jakes, MD 06/09/13 1414

## 2013-06-09 NOTE — ED Notes (Signed)
CRITICAL VALUE ALERT  Critical value received:  Glucose 705  Date of notification:  06/09/13  Time of notification:  1202  Critical value read back:yes  Nurse who received alert:  Tilman Neat  MD notified (1st page):  Dr. Saul Fordyce  Time of first page:  1202  MD notified (2nd page):  Time of second page:  Responding MD:  Dr. Saul Fordyce  Time MD responded:  1202

## 2013-06-09 NOTE — H&P (Signed)
Patient seen, independently examined and chart reviewed. I agree with exam, assessment and plan discussed with Kylie Smothers, NP.  Very pleasant 72 year old woman with a history of diabetes with very poor control recently superimposed on more chronic difficulties since her husband was placed in a nursing home 6 months ago. She has had difficulty keeping up with her insulin regimen. In emergency department blood sugar was over 700. At this point we will continue IV insulin infusion until blood sugars are under better control, then transitioned to subcutaneous insulin. We will involve social work, care management, physical therapy and give consideration to assisted living or placement as clinically indicated.  Kylie Sacks, MD [facility] [phone]

## 2013-06-09 NOTE — ED Notes (Signed)
Pt's son,Timothy Suleiman, contact information. [phone].

## 2013-06-09 NOTE — ED Notes (Signed)
Was sent to the ER for evaluation of high blood sugar by Dr. Deirdre Peer office.

## 2013-06-09 NOTE — H&P (Signed)
[facility] History and Physical  JERRIKA LEDLOW AVW:[id number] DOB: [birth year] DOA: 06/09/2013  Referring physician:  PCP: [name], MD  Specialists:   Chief Complaint: hyperglycemia  HPI: Kylie Tate is a delightful  72 y.o. female with past medical history that includes diabetes, hypertension, chronic pancreatitis, chronic kidney disease stage II, hyperlipidemia presents to the emergency department with the chief complaint of hyperglycemia. Information is obtained from the patient and her son who is at the bedside as well as chart review. Patient was seen on 7/18 for CBG over 600. She was discharged in stable condition with instructions to followup with her endocrinologist. Chart review indicates she was seen yesterday by her PCP opined that the patient has had difficulty controlling her diabetes ever since her husband went in to attend center back in May. Yesterday her Levemir was increased to 50 units and NovoLog to 25 units with each meal. Patient unable to remember if she made said adjustments. Of note PCP notes from yesterday also documents some concern for memory issues. MMSE was deferred until hyperglycemia resolved. Patient was seen today by [facility]  and advised to come to the emergency room. She denies any recent fever chills sick contacts. She denies any abdominal pain nausea vomiting constipation diarrhea melena. She does indicate unintentional weight loss over the last 6 months. Family acknowledges patient with gradual decline since her husband has been in the nursing home. They report that she has fallen at least twice in the last 3 months. Both were mechanical falls without injury. Blood sugar initially in the emergency room was 700 and she was given 10 units of IV insulin and her blood sugar came down to 400. She also received 500 cc bolus of normal saline and maintenance normal saline started at 100 mils per hour. Chest x-ray is negative for any pneumonia or edema. Lab work  significant for sodium of 132 BUN 42 and creatinine of 1.46 as well as a hemoglobin of 11.6. No evidence of acidosis. Enzymes came on gradually have persisted and worsened and characterized as severe. [facility] are asked to admit   Review of Systems: The patient denies anorexia, fever, weight loss,, vision loss, decreased hearing, hoarseness, chest pain, syncope, dyspnea on exertion, peripheral edema, balance deficits, hemoptysis, abdominal pain, melena, hematochezia, severe indigestion/heartburn, hematuria, incontinence, genital sores, muscle weakness, suspicious skin lesions, transient blindness, difficulty walking, depression,  abnormal bleeding, enlarged lymph nodes, angioedema, and breast masses.    Past Medical History  Diagnosis Date  . Diabetes mellitus   . Hypertension   . GERD (gastroesophageal reflux disease)   . Hyperlipidemia   . Diverticulitis   . Thyroid disease     multinodular goiter  . Chronic pancreatitis   . Depression   . CRI (chronic renal insufficiency)   . Anemia, chronic disease   . Vertigo   . Diverticulosis    Past Surgical History  Procedure Laterality Date  . Nissen fundoplication    . Abdominal exploration surgery      abd pain-diverticulitis  . Colonoscopy  06/12/2000    Brodie-diffuse melanosis coli/mucousy exudate throughtout colon s/p bx (benign)  . Cholecystectomy  2001  . Colonoscopy with esophagogastroduodenoscopy (egd) N/A 03/10/2013    Procedure: COLONOSCOPY WITH ESOPHAGOGASTRODUODENOSCOPY (EGD);  Surgeon: West Bali, MD;  [facility];  Service: Endoscopy;  Laterality: N/A;  9:45  . Maloney dilation N/A 03/10/2013    Procedure: [name] DILATION;  Surgeon: West Bali, MD;  Location[facility];  Service: Endoscopy;  Laterality: N/A;  . Savory dilation N/A 03/10/2013    Procedure: SAVORY DILATION;  Surgeon: West Bali, MD;  Location[facility];  Service: Endoscopy;  Laterality: N/A;  . Colonoscopy N/A  04/26/2013    Procedure: COLONOSCOPY;  Surgeon: West Bali, MD;  [facility];  Service: Endoscopy;  Laterality: N/A;  9:30-rescheduled to 8:30 Pt aware of new time per Durward Mallard   Social History:  reports that she has never smoked. She does not have any smokeless tobacco history on file. She reports that she does not drink alcohol or use illicit drugs. Patient currently lives alone as her husband has been intense center since May. She has 3 children who are fairly close by but she does not have 24 7 assistance. There is a neighbor who helps her with her medications on occasion. She currently ambulates independently.  No Known Allergies  Family History  Problem Relation Age of Onset  . Heart disease    . Arthritis    . Cancer Brother     prostate  . Diabetes    . Heart disease Mother   . Hypertension Mother   . Hyperlipidemia Mother   . Diabetes Mother   . Depression Father   . Diabetes Father   . Diabetes Sister   . Colon cancer Neg Hx   . Pancreatitis Neg Hx      Prior to Admission medications   Medication Sig Start Date End Date Taking? Authorizing Provider  aspirin EC 81 MG tablet Take 81 mg by mouth daily. For Blood Thinner   Yes Historical Provider, MD  atorvastatin (LIPITOR) 10 MG tablet Take 10 mg by mouth every morning. For Cholesterol   Yes Historical Provider, MD  diazepam (VALIUM) 5 MG tablet Take 5 mg by mouth 3 (three) times daily.   Yes Historical Provider, MD  diltiazem (CARDIZEM CD) 180 MG 24 hr capsule Take 180 mg by mouth every morning. For Blood Pressure   Yes Historical Provider, MD  fish oil-omega-3 fatty acids 1000 MG capsule Take 1 g by mouth daily.   Yes Historical Provider, MD  gabapentin (NEURONTIN) 100 MG capsule Take 100 mg by mouth 2 (two) times daily. Neuropathy   Yes Historical Provider, MD  imipramine (TOFRANIL) 10 MG tablet Take 30 mg by mouth at bedtime. 03/04/13  Yes Salley Scarlet, MD  insulin aspart (NOVOLOG FLEXPEN) 100 UNIT/ML  injection Inject 25 Units into the skin 3 (three) times daily before meals.    Yes Historical Provider, MD  insulin detemir (LEVEMIR FLEXPEN) 100 UNIT/ML injection Inject 50 Units into the skin at bedtime.    Yes Historical Provider, MD  levothyroxine (SYNTHROID, LEVOTHROID) 50 MCG tablet Take 50 mcg by mouth daily. For THYROID   Yes Historical Provider, MD  lipase/protease/amylase (CREON) 12000 UNITS CPEP Take 2 capsules by mouth 3 (three) times daily. Takes 2 capsules with meals and 1 with snacks.   Yes Historical Provider, MD  losartan (COZAAR) 50 MG tablet Take 50 mg by mouth daily.   Yes Historical Provider, MD  Multiple Vitamins-Minerals (CENTRUM PO) Take 1 tablet by mouth daily.    Yes Historical Provider, MD  nabumetone (RELAFEN) 500 MG tablet Take 500 mg by mouth 2 (two) times daily.   Yes Historical Provider, MD  omeprazole (PRILOSEC) 20 MG capsule Take 20 mg by mouth daily. For Acid Reflux/GERD   Yes Historical Provider, MD  ranitidine (ZANTAC) 150 MG tablet Take 150 mg by mouth daily.  Yes Historical Provider, MD   Physical Exam: Filed Vitals:   06/09/13 1112  BP: 162/73  Pulse: 103  Temp: 98 F (36.7 C)  TempSrc: Oral  Resp: 24  Height: 5\' 6"  (1.676 m)  Weight: 174 lb (78.926 kg)  SpO2: 97%     General:  Well-nourished alert no acute distress  Eyes: PERRLA, EOMI, no scleral icterus  ENT: Ears clear nose without drainage oropharynx without erythema or exudate. Mucous membranes of her mouth are slightly pale and very dry  Neck: Supple no JVD full range of motion no lymphadenopathy  Cardiovascular: Regular rate and rhythm no murmur gallop or rub no lower extremity edema  Respiratory: Normal effort breath sounds slightly distant but clear to auscultation bilaterally no rhonchi no wheeze  Abdomen: Round soft positive bowel sounds nontender to palpation no mass organomegaly noted  Skin: Warm and dry no rash no lesions  Musculoskeletal: No clubbing no cyanosis joints  without swelling or erythema moves all extremities  Psychiatric: Calm cooperative  Neurologic: Cranial nerves II through XII grossly intact speech clear but slowed and deliberate facial  Labs on Admission:  Basic Metabolic Panel:  Recent Labs Lab 06/04/13 2207 06/08/13 1044 06/08/13 1132 06/09/13 1136  NA 130* 138  --  132*  K 4.3 5.1  --  4.8  CL 95* 99  --  97  CO2 23 23  --  23  GLUCOSE 501* 531*  >444* >444* 705*  BUN 36* 37*  --  42*  CREATININE 1.36* 1.90*  --  1.46*  CALCIUM 9.8 9.8  --  9.6   Liver Function Tests:  Recent Labs Lab 06/09/13 1136  AST 22  ALT 22  ALKPHOS 156*  BILITOT 0.3  PROT 7.8  ALBUMIN 3.5   No results found for this basename: LIPASE, AMYLASE,  in the last 168 hours No results found for this basename: AMMONIA,  in the last 168 hours CBC:  Recent Labs Lab 06/04/13 2207 06/09/13 1136  WBC 6.5 6.9  NEUTROABS 3.9 4.7  HGB 11.8* 11.6*  HCT 35.1* 34.9*  MCV 90.7 92.1  PLT 253 246   Cardiac Enzymes: No results found for this basename: CKTOTAL, CKMB, CKMBINDEX, TROPONINI,  in the last 168 hours  BNP (last 3 results) No results found for this basename: PROBNP,  in the last 8760 hours CBG:  Recent Labs Lab 06/04/13 2110 06/05/13 06/05/13 0127 06/09/13 1121 06/09/13 1355  GLUCAP 476* 419* 383* >600* 433*    Radiological Exams on Admission: Dg Chest 2 View  06/09/2013   *RADIOLOGY REPORT*  Clinical Data: Hyperglycemia.  CHEST - 2 VIEW  Comparison: CT and plain film chest 09/22/2010.  Findings: The lungs are clear.  Heart size is normal.  No pneumothorax or pleural effusion.  IMPRESSION: No acute disease.   Original Report Authenticated By: Holley Dexter, M.D.    EKG  Assessment/Plan Principal Problem:   Hyperglycemia without ketosis: Likely related to medication error and/or noncompliance. Chart review and discussion with family indicates that patient has had increasing difficulty managing her diabetes since her husband  was placed in a nursing home. Family states that they are just now realizing how much patient's husband providing care around medication administration. Will admit to step down unit. Will start IV insulin per stabilizer protocol. Once CBG is less than 250 for 4 hours will transition to basal insulin.  Active Problems:  Acute on chronic renal failure stage II:  Likely related to #1. Chart review indicates current creatinine level  of 1.4 as above her baseline. Will provide IV hydration and recheck in the morning. Will hold any nephrotoxins until then.   Hyponatremia: Likely related to #1. Expect resolution wants glucose controlled. Will recheck in the morning.    Dehydration: Related to #1. Will support with IV fluids per glucose stabilizer protocol. Will monitor intake and output.  Chronic pancreatitis: Stable at baseline. Continue home medications.    GERD (gastroesophageal reflux disease): Stable at baseline. Will continue her PPI    Hypertension: Fair control. Will hold her ARB for now do to renal function. Monitor closely. Continue Cardizem.    Diabetes mellitus, type 2: See #1    Hyperlipidemia: We'll continue her statin. We'll check a fasting lipid panel  Hypothyroid: Check TSH. Continue Synthroid  Depression. Stable at baseline we'll continue home meds.  Vertigo. History of same. Patient is on Valium 5 mg 3 times a day. She reports that she takes this every day. Will continue. Will request PT evaluation given history of vertigo and recent falls      Code Status: Full Family Communication: Son at bedside Disposition Plan: Patient currently lives at home alone. It appears that she will need higher level of supervision do to memory issues. Request social work to evaluate for placement if appropriate. Family and patient have verbalized willingness for patient to be placed.  Time spent:75 minutes  Gwenyth Bender [facility] Pager [phone]  If 7PM-7AM, please contact  night-coverage [email or web address] Password [facility] 06/09/2013, 3:31 PM

## 2013-06-10 ENCOUNTER — Ambulatory Visit: Payer: Medicare Other | Admitting: Physician Assistant

## 2013-06-10 DIAGNOSIS — N179 Acute kidney failure, unspecified: Secondary | ICD-10-CM

## 2013-06-10 DIAGNOSIS — N189 Chronic kidney disease, unspecified: Secondary | ICD-10-CM

## 2013-06-10 LAB — COMPREHENSIVE METABOLIC PANEL
AST: 19 U/L (ref 0–37)
Alkaline Phosphatase: 110 U/L (ref 39–117)
CO2: 25 mEq/L (ref 19–32)
Chloride: 104 mEq/L (ref 96–112)
Creatinine, Ser: 1.33 mg/dL — ABNORMAL HIGH (ref 0.50–1.10)
GFR calc non Af Amer: 39 mL/min — ABNORMAL LOW (ref >90)
Potassium: 3.7 mEq/L (ref 3.5–5.1)
Total Bilirubin: 0.3 mg/dL (ref 0.3–1.2)

## 2013-06-10 LAB — CBC
MCH: 30.4 pg (ref 26.0–34.0)
MCHC: 33.8 g/dL (ref 30.0–36.0)
Platelets: 229 10*3/uL (ref 150–400)
RBC: 3.52 MIL/uL — ABNORMAL LOW (ref 3.87–5.11)
RDW: 13.2 % (ref 11.5–15.5)

## 2013-06-10 LAB — GLUCOSE, CAPILLARY
Glucose-Capillary: 120 mg/dL — ABNORMAL HIGH (ref 70–99)
Glucose-Capillary: 220 mg/dL — ABNORMAL HIGH (ref 70–99)

## 2013-06-10 LAB — TSH: TSH: 0.562 u[IU]/mL (ref 0.350–4.500)

## 2013-06-10 MED ORDER — FAMOTIDINE 20 MG PO TABS
10.0000 mg | ORAL_TABLET | Freq: Every day | ORAL | Status: DC
  Administered 2013-06-10 – 2013-06-11 (×2): 10 mg via ORAL
  Filled 2013-06-10 (×2): qty 1

## 2013-06-10 MED ORDER — INSULIN ASPART 100 UNIT/ML ~~LOC~~ SOLN
0.0000 [IU] | Freq: Every day | SUBCUTANEOUS | Status: DC
  Administered 2013-06-10: 2 [IU] via SUBCUTANEOUS
  Administered 2013-06-11: 3 [IU] via SUBCUTANEOUS

## 2013-06-10 MED ORDER — INSULIN ASPART 100 UNIT/ML ~~LOC~~ SOLN
0.0000 [IU] | Freq: Three times a day (TID) | SUBCUTANEOUS | Status: DC
  Administered 2013-06-10: 11 [IU] via SUBCUTANEOUS
  Administered 2013-06-10 (×2): 8 [IU] via SUBCUTANEOUS
  Administered 2013-06-11: 15 [IU] via SUBCUTANEOUS
  Administered 2013-06-11 (×2): 8 [IU] via SUBCUTANEOUS
  Administered 2013-06-12: 5 [IU] via SUBCUTANEOUS

## 2013-06-10 MED ORDER — INSULIN ASPART 100 UNIT/ML ~~LOC~~ SOLN: 0.0000 [IU] | Freq: Every day | SUBCUTANEOUS | Status: DC

## 2013-06-10 MED ORDER — INSULIN DETEMIR 100 UNIT/ML ~~LOC~~ SOLN
25.0000 [IU] | Freq: Every day | SUBCUTANEOUS | Status: DC
  Administered 2013-06-10: 25 [IU] via SUBCUTANEOUS
  Filled 2013-06-10 (×2): qty 0.25

## 2013-06-10 NOTE — Progress Notes (Signed)
Patient is currently active with [facility] Care Management for chronic disease management services.  Patient has been engaged by a [employer] and LCSW.  Our community based plan of care has focused on disease management of IDDM.  She and her spouse have been followed closely by our RN community care coordinator.  She has had a noted decline with the recent SNF placement of her spouse.  He supported her care in the home in addition to her sons.  Prior to his CVA she went to the [facility] 3 x week and was very stable.  Currently we recommend ALF placement because her husband is not likely to recover to his baseline.  Our community based tean has established a strong/influential rapport with her.  We are available to assist with placement discussions with her and her sons as needed.  In the event she returns home we will continue to engage her sons to assist in placement.  Patient will receive a post discharge transition of care call and will be evaluated for monthly home visits for assessments and disease process education.  Made inpatient Case Manager Arlyss Queen aware that [facility] Care Management following. Of note, [facility] Care Management services does not replace or interfere with any services that are arranged by inpatient case management or social work.  For additional questions or referrals please contact Anibal Henderson BSN RN [facility] Liaison at [phone].

## 2013-06-10 NOTE — Progress Notes (Signed)
Paged MD due to patient having 4 CBG's within range on glucostabilizer and in need of transition orders, Orders to start SSI in AM and D/C gtt given.

## 2013-06-10 NOTE — Progress Notes (Signed)
[facility] PROGRESS NOTE  Kylie Tate WUJ:[id number] DOB: 1941-01-26 DOA: 06/09/2013 PCP: [name], MD  Assessment/Plan: Hyperglycemia without ketosis: Likely related to medication error and/or noncompliance. Chart review and discussion with family indicates that patient has had increasing difficulty managing her diabetes since her husband was placed in a nursing home. Improved. Off glucostabalizer 2am. Resume levemir at 25units. Continue SSI.  A1C 8.4 indicating poor control. 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 Chart review indicates current creatinine level of 1.4. Creatine trending down after IV hydration. Continue to hold ARB 1 more day.    Hyponatremia: Likely related to #1. Resolved   Dehydration: Related to #1. Resolved   Chronic pancreatitis: Stable at baseline. Continue home medications.   GERD (gastroesophageal reflux disease): Stable at baseline. Will continue her PPI   Hypertension: Fair control. Continue to hold her ARB for now do to renal function. Monitor closely. Continue Cardizem.  Diabetes mellitus, type 2: See #1  Hyperlipidemia: We'll continue her statin. Triglycerides 782.   Hypothyroid: TSH within the limits of normal. Continue Synthroid   Depression. Stable at baseline we'll continue home meds.   Vertigo. History of same. Patient is on Valium 5 mg 3 times a day. She reports that she takes this every day. Will continue. Will request PT evaluation given history of vertigo and recent falls     Code Status: full Family Communication:  Disposition Plan: home when ready. May benefit from assisted living given issues with memory and impact on ability to manage DM   Consultants:  none  Procedures:  none  Antibiotics:  none  HPI/Subjective: Awake alert oriented x3. Denies pain/discomfort. Reports feeling  "much better than yesterday"  Objective: Filed Vitals:   06/10/13 0500 06/10/13 0600 06/10/13 0700 06/10/13 0807  BP: 143/65 133/58    Pulse: 76 77 79   Temp:    97.8 F (36.6 C)  TempSrc:    Oral  Resp: 18 17 17    Height:      Weight: 178 lb 5.6 oz (80.9 kg)     SpO2: 93% 94% 93%     Intake/Output Summary (Last 24 hours) at 06/10/13 0922 Last data filed at 06/10/13 0700  Gross per 24 hour  Intake 1153.42 ml  Output      0 ml  Net 1153.42 ml   Filed Weights   06/09/13 1112 06/09/13 1533 06/10/13 0500  Weight: 174 lb (78.926 kg) 169 lb 8.5 oz (76.9 kg) 178 lb 5.6 oz (80.9 kg)    Exam:   General:  Obese NAD  Cardiovascular: RRR No MGR No LE edema  Respiratory: normal effort BS clear bilaterally no wheeze no rhonchi  Abdomen: obese soft +BS non-tender to palpation no mass no guarding  Musculoskeletal: no clubbing no cyanosis   Data Reviewed: Basic Metabolic Panel:  Recent Labs Lab 06/04/13 2207 06/08/13 1044 06/08/13 1132 06/09/13 1136 06/10/13 0412  NA 130* 138  --  132* 136  K 4.3 5.1  --  4.8 3.7  CL 95* 99  --  97 104  CO2 23 23  --  23 25  GLUCOSE 501* 531*  >444* >444* 705* 217*  BUN 36* 37*  --  42* 33*  CREATININE 1.36* 1.90*  --  1.46* 1.33*  CALCIUM 9.8 9.8  --  9.6 9.0   Liver Function Tests:  Recent Labs Lab 06/09/13 1136 06/10/13  0412  AST 22 19  ALT 22 19  ALKPHOS 156* 110  BILITOT 0.3 0.3  PROT 7.8 6.6  ALBUMIN 3.5 3.1*   No results found for this basename: LIPASE, AMYLASE,  in the last 168 hours No results found for this basename: AMMONIA,  in the last 168 hours CBC:  Recent Labs Lab 06/04/13 2207 06/09/13 1136 06/10/13 0412  WBC 6.5 6.9 6.2  NEUTROABS 3.9 4.7  --   HGB 11.8* 11.6* 10.7*  HCT 35.1* 34.9* 31.7*  MCV 90.7 92.1 90.1  PLT 253 246 229   Cardiac Enzymes: No results found for this basename: CKTOTAL, CKMB, CKMBINDEX, TROPONINI,  in the last 168 hours BNP (last 3 results) No results found for this  basename: PROBNP,  in the last 8760 hours CBG:  Recent Labs Lab 06/09/13 2210 06/09/13 2305 06/09/13 2357 06/10/13 0142 06/10/13 0721  GLUCAP 168* 147* 121* 120* 286*    Recent Results (from the past 240 hour(s))  URINE CULTURE     Status: None   Collection Time    06/04/13 11:33 PM      Result Value Range Status   Specimen Description URINE, CLEAN CATCH   Final   Special Requests NONE   Final   Culture  Setup Time 06/05/2013 19:22   Final   Colony Count NO GROWTH   Final   Culture NO GROWTH   Final   Report Status 06/06/2013 FINAL   Final  MRSA PCR SCREENING     Status: None   Collection Time    06/09/13  4:05 PM      Result Value Range Status   MRSA by PCR NEGATIVE  NEGATIVE Final   Comment:            The GeneXpert MRSA Assay (FDA     approved for NASAL specimens     only), is one component of a     comprehensive MRSA colonization     surveillance program. It is not     intended to diagnose MRSA     infection nor to guide or     monitor treatment for     MRSA infections.     Studies: Dg Chest 2 View  06/09/2013   *RADIOLOGY REPORT*  Clinical Data: Hyperglycemia.  CHEST - 2 VIEW  Comparison: CT and plain film chest 09/22/2010.  Findings: The lungs are clear.  Heart size is normal.  No pneumothorax or pleural effusion.  IMPRESSION: No acute disease.   Original Report Authenticated By: Holley Dexter, M.D.    Scheduled Meds: . aspirin EC  81 mg Oral Daily  . atorvastatin  10 mg Oral q morning - 10a  . diazepam  5 mg Oral TID  . diltiazem  180 mg Oral q morning - 10a  . famotidine  10 mg Oral Daily  . gabapentin  100 mg Oral BID  . heparin  5,000 Units Subcutaneous Q8H  . imipramine  30 mg Oral QHS  . insulin aspart  0-15 Units Subcutaneous TID WC  . [START ON 06/11/2013] insulin aspart  0-5 Units Subcutaneous QHS  . insulin detemir  25 Units Subcutaneous Daily  . levothyroxine  50 mcg Oral QAC breakfast  . lipase/protease/amylase  2 capsule Oral TID  .  nabumetone  500 mg Oral BID  . pantoprazole  40 mg Oral Daily  . senna  1 tablet Oral BID   Continuous Infusions: . sodium chloride 100 mL/hr at 06/09/13 1328    Principal Problem:  Hyperglycemia without ketosis Active Problems:   Diabetes mellitus, type 2   Hyperlipidemia   Chronic pancreatitis   GERD (gastroesophageal reflux disease)   Hypertension   Hyponatremia   Dehydration   Acute on chronic renal failure    Time spent: 30 minutes    [facility] M  [facility] Pager [phone]. If 7PM-7AM, please contact night-coverage at [email or web address], password [facility] 06/10/2013, 9:22 AM  LOS: 1 day

## 2013-06-10 NOTE — Progress Notes (Signed)
Patient seen, independently examined and chart reviewed. I agree with exam, assessment and plan discussed with [name], NP.  Blood sugars have normalized and patient has been transitioned off IV insulin. She feels fine today and has no new complaints. Acute renal failure appears to have resolved and creatinine appears to be near likely baseline. Will d/c Relafen for now. Normocytic anemia is noted and is likely secondary to chronic disease and can be followed up in the outpatient setting. It is difficult to know her true insulin requirement. She reports that she was on Levemir 30 >> 40 >> 50 (recently) but it is not clear that she was taking this as directed consistently, possibly secondary to memory problems. Restart we will near a lower dose and monitor blood sugar control. Sliding-scale insulin. Physical therapy evaluation. Anticipate stability for discharge within 48 hours. It appears she would benefit from assistance with medication management and ALF or SNF should be considered.  She can transfer to the medical floor.  [name], MD [facility] [phone]

## 2013-06-10 NOTE — Care Management Note (Signed)
    Page 1 of 2   06/11/2013     2:44:51 PM   CARE MANAGEMENT NOTE 06/11/2013  Patient:  Kylie Tate, Kylie Tate   Account Number:  [id number]  Date Initiated:  06/10/2013  Documentation initiated by:  Sharrie Rothman  Subjective/Objective Assessment:   Pt admitted from home with hyperglycemia. Pt lives alone, husband is in [facility] for rehab. Pts childen live in different cities but does follow up closely with the pt. Pt is followed by [facility]. Pt is fairly independent with ADL's.     Action/Plan:   Pt does have neighbors that check on her frequently and she has a shower chair in the home.Pt would benefit from [facility] RN and is agreeable to that. Will continue to follow for [facility] needs.   Anticipated DC Date:  06/12/2013   Anticipated DC Plan:  HOME W HOME HEALTH SERVICES      DC Planning Services  CM consult      [facility] Choice  HOME HEALTH   Choice offered to / List presented to:  C-4 Adult Children        HH arranged  HH-1 RN      [facility] agency  [facility]   Status of service:  Completed, signed off Medicare Important Message given?  YES (If response is "NO", the following Medicare IM given date fields will be blank) Date Medicare IM given:  06/11/2013 Date Additional Medicare IM given:    Discharge Disposition:  HOME W HOME HEALTH SERVICES  Per UR Regulation:    If discussed at Long Length of Stay Meetings, dates discussed:    Comments:  06/11/13 1440 Arlyss Queen, RN BSN CM Pt for potential discharge over the weekend. CM spoke with pts son, Marcial Pacas, by phone about discharge arrangements.Son chose [facility] for RN. Alroy Bailiff of [facility] is aware and will collect the pts information from the chart. Weekend staff will call and fax HH orders to [facility] once written. CM also left list of private duty sitter agencies in pts room per son request. Pt will be staying with family member in [place] for a week before going back home. [facility] will be provided that address for followup. Pt and pts  nurse aware of discharge arrangements. No DME needs noted.  06/10/13 1515 Arlyss Queen, RN BSN CM

## 2013-06-10 NOTE — Evaluation (Signed)
Physical Therapy Evaluation Patient Details Name: Kylie Tate MRN: [id number] DOB: 05-10-1941 Today's Date: 06/10/2013 Time: 4098-1191 PT Time Calculation (min): 24 min  PT Assessment / Plan / Recommendation History of Present Illness  Pt is admitted with hyperglycemia.  She lives alone since husband is at [facility] due to stroke.  Clinical Impression  Pt was seen for evaluation.  She has no significant problems with strength, balance or endurance.  We discussed the possibility of her going to ACLF but she declines...she is hoping that her husband will be able to come home from [facility] at some time in the future.  Will ask nursing service to ambulate her.    PT Assessment  Patent does not need any further PT services    Follow Up Recommendations  No PT follow up    Does the patient have the potential to tolerate intense rehabilitation      Barriers to Discharge        Equipment Recommendations  None recommended by PT    Recommendations for Other Services     Frequency      Precautions / Restrictions Precautions Precautions: None Restrictions Weight Bearing Restrictions: No   Pertinent Vitals/Pain       Mobility  Bed Mobility Bed Mobility: Supine to Sit;Sit to Supine Supine to Sit: 6: Modified independent (Device/Increase time) Sit to Supine: 6: Modified independent (Device/Increase time) Transfers Transfers: Sit to Stand;Stand to Sit Sit to Stand: 6: Modified independent (Device/Increase time) Stand to Sit: 6: Modified independent (Device/Increase time) Ambulation/Gait Ambulation/Gait Assistance: 6: Modified independent (Device/Increase time) Ambulation Distance (Feet): 300 Feet Assistive device: None Gait Pattern: Within Functional Limits Stairs: No Wheelchair Mobility Wheelchair Mobility: No    Exercises     PT Diagnosis:    PT Problem List:   PT Treatment Interventions:       PT Goals(Current goals can be found in the care plan section) Acute Rehab PT  Goals PT Goal Formulation: No goals set, d/c therapy  Visit Information  Last PT Received On: 06/10/13 History of Present Illness: Pt is admitted with hyperglycemia.  She lives alone since husband is at [facility] due to stroke.       Prior Functioning  Home Living Family/patient expects to be discharged to:: Private residence Living Arrangements: Alone Available Help at Discharge: Family;Available PRN/intermittently Type of Home: House Home Access: Level entry Home Layout: One level Home Equipment: Cane - single point Prior Function Level of Independence: Independent Communication Communication: No difficulties    Cognition  Cognition Arousal/Alertness: Awake/alert Behavior During Therapy: WFL for tasks assessed/performed Overall Cognitive Status: Within Functional Limits for tasks assessed    Extremity/Trunk Assessment Upper Extremity Assessment Upper Extremity Assessment: Overall WFL for tasks assessed Lower Extremity Assessment Lower Extremity Assessment: Overall WFL for tasks assessed   Balance Balance Balance Assessed:  (WNL by functional observation)  End of Session PT - End of Session Equipment Utilized During Treatment: Gait belt Activity Tolerance: Patient tolerated treatment well Patient left: in chair;with call bell/phone within reach Nurse Communication: Mobility status  GP     Konrad Penta 06/10/2013, 1:41 PM

## 2013-06-11 LAB — BASIC METABOLIC PANEL
CO2: 25 mEq/L (ref 19–32)
Calcium: 9.4 mg/dL (ref 8.4–10.5)
Chloride: 103 mEq/L (ref 96–112)
Creatinine, Ser: 1.27 mg/dL — ABNORMAL HIGH (ref 0.50–1.10)
GFR calc Af Amer: 48 mL/min — ABNORMAL LOW (ref >90)
Sodium: 137 mEq/L (ref 135–145)

## 2013-06-11 LAB — GLUCOSE, CAPILLARY
Glucose-Capillary: 294 mg/dL — ABNORMAL HIGH (ref 70–99)
Glucose-Capillary: 274 mg/dL — ABNORMAL HIGH (ref 70–99)

## 2013-06-11 MED ORDER — INSULIN DETEMIR 100 UNIT/ML ~~LOC~~ SOLN
15.0000 [IU] | Freq: Once | SUBCUTANEOUS | Status: AC
  Administered 2013-06-11: 15 [IU] via SUBCUTANEOUS
  Filled 2013-06-11: qty 0.15

## 2013-06-11 MED ORDER — INSULIN DETEMIR 100 UNIT/ML ~~LOC~~ SOLN
35.0000 [IU] | Freq: Every day | SUBCUTANEOUS | Status: DC
  Administered 2013-06-11: 35 [IU] via SUBCUTANEOUS
  Filled 2013-06-11 (×4): qty 0.35

## 2013-06-11 MED ORDER — INSULIN DETEMIR 100 UNIT/ML ~~LOC~~ SOLN
50.0000 [IU] | Freq: Every day | SUBCUTANEOUS | Status: DC
  Filled 2013-06-11 (×3): qty 0.5

## 2013-06-11 NOTE — Progress Notes (Signed)
Patient seen, independently examined and chart reviewed. I agree with exam, assessment and plan discussed with [name], NP.  She feels better today. Eating fine. No new complaints. Her blood sugars are better controlled and she is off the insulin infusion but remains hyperglycemic. We will increase her Levemir to 50 units daily and monitor overnight. She has declined assisted living and plans to return home. Anticipate discharge 7/26.  [name], MD [facility] [phone]

## 2013-06-11 NOTE — Progress Notes (Signed)
Inpatient Diabetes Program Recommendations  AACE/ADA: New Consensus Statement on Inpatient Glycemic Control (2013)  Target Ranges:  Prepandial:   less than 140 mg/dL      Peak postprandial:   less than 180 mg/dL (1-2 hours)      Critically ill patients:  140 - 180 mg/dL   Results for Kylie Tate, Kylie Tate (MRN [id number]) as of 06/11/2013 07:12  Ref. Range 06/10/2013 01:42 06/10/2013 07:21 06/10/2013 11:18 06/10/2013 16:14 06/10/2013 21:22  Glucose-Capillary Latest Range: 70-99 mg/dL 409 (H) 811 (H) 914 (H) 268 (H) 220 (H)  Results for Kylie Tate, Kylie Tate (MRN [id number]) as of 06/11/2013 07:12  Ref. Range 06/10/2013 04:12 06/11/2013 04:06  Glucose Latest Range: 70-99 mg/dL 086 (H) 578 (H)    Inpatient Diabetes Program Recommendations Insulin - Basal: Please consider increasing Levemir to 35 units QHS.  Note: Patient has a history of diabetes and takes Levemir 50 units QHS and Novolog 25 units TID at home for diabetes management.  Initial lab glucose was noted to be 705 mg/dl on 4/69 @ 62:95 and patient was placed on an insulin drip.  The insulin drip was stopped yesterday morning around 1am and patient received first dose of SQ Novolog correction insulin at 7:58 am on 06/10/13.  Basal insulin was not given at time of transition (as per protocol basal insulin should have been given 1-2 hours prior to discontinuing the insulin drip); instead the first dose of Levemir 25 units was not given until 10:05am on 06/10/13.  Currently, patient is ordered to receive Levemir 25 units QHS, Novolog 0-15 units AC, and Novolog 0-5 units HS for inpatient glycemic control.  Blood glucose since time of transition to SQ insulin has ranged from 217-331 mg/dl and fasting blood glucose this morning was 218 mg/dl on am labs.  Please consider increasing Levemir to 35 units QHS to improve inpatient glycemic control.  Will continue to follow.  Thanks, Orlando Penner, RN, MSN, CCRN Diabetes Coordinator Inpatient Diabetes  Program [phone]

## 2013-06-11 NOTE — Clinical Social Work Psychosocial (Signed)
    Clinical Social Work Department BRIEF PSYCHOSOCIAL ASSESSMENT 06/11/2013  Patient:  Kylie Tate, Kylie Tate     Account Number:  [id number]     Admit date:  06/09/2013  Clinical Social Worker:  Santa Genera, CLINICAL SOCIAL WORKER  Date/Time:  06/11/2013 10:00 AM  Referred by:  Physician  Date Referred:  06/10/2013 Referred for  ALF Placement   Other Referral:   Interview type:  Patient Other interview type:   Also spoke w family in room.    PSYCHOSOCIAL DATA Living Status:  ALONE Admitted from facility:   Level of care:   Primary support name:  Kylie Tate Primary support relationship to patient:  CHILD, ADULT Degree of support available:   Patient needs more support to manage medications/diabetes at home, lives alone, children live out of town, has intermittent supervision    CURRENT CONCERNS  Other Concerns:    SOCIAL WORK ASSESSMENT / PLAN CSW met w patient at bedside, patient alert and oriented x4.  Currently lives alone, husband had stroke 2 months ago and is currently rehabbing at [facility].  Per patient is "making progress, is able to feed himself now." Per patient, husband used to help her manage her diabetes medications, patient now has help from neighbor and nurse who comes once/week and "puts my medications together."    Patient acknowledged that she took the wrong medication resulting in complications from high sugar leading to current hospitalization. Seems to understand that she needs more help in managing diabetes care and decision making. Asked CSW to contact son to discuss how to get more care into the home.  Patient was given ALF list but clearly states that she wants to remain at home and understands need to pay for additional care.  CSW discussed financial implications of ALF and private duty sitters as both these options would need to be paid for privately.    CSW spoke w son, Kylie Tate, by phone.  Says children have discussed situation and  realize mother will need more help.  They want to investigate bringing more help into the home first, if that does not work, then family may consider other placement options.  Son asked for information on applying for Medicaid, says patient has limited income. CSW will provide handout on Medicaid application process, asked RN CM to provide list of private duty agencies.    CSW sigining off as patient declined ALF placement, no further SW needs identified.   Assessment/plan status:  No Further Intervention Required Other assessment/ plan:   Information/referral to community resources:   ALF list  Medicaid application information    PATIENT'S/FAMILY'S RESPONSE TO PLAN OF CARE: Patient declined ALF placement, asked CSW to call son to discuss options for increased assistance at home.        Santa Genera, LCSW Clinical Social Worker [phone])

## 2013-06-11 NOTE — Progress Notes (Signed)
  RD consulted for nutrition education regarding diabetes.   Lab Results  Component Value Date   HGBA1C 8.4* 06/09/2013    RD provided "Carbohydrate Counting for People with Diabetes" handout from the Academy of Nutrition and Dietetics. Discussed different food groups and their effects on blood sugar, emphasizing carbohydrate-containing foods. Provided list of carbohydrates and recommended serving sizes of common foods.  Discussed importance of controlled and consistent carbohydrate intake throughout the day. Provided examples of ways to balance meals/snacks and encouraged intake of high-fiber, whole grain complex carbohydrates. Teach back method used.  Expect good compliance.  Body mass index is 31.05 kg/(m^2). Pt meets criteria for obesity class I based on current BMI.  Current diet order is CHO Modified Medium (1600-2000 kcal) , patient is consuming approximately 100% of meals at this time. Labs and medications reviewed. No further nutrition interventions warranted at this time. RD contact information provided. If additional nutrition issues arise, please re-consult RD.  Royann Shivers MS,RD,LDN,CSG Office: [phone] Pager: [phone]

## 2013-06-11 NOTE — Progress Notes (Signed)
[facility] PROGRESS NOTE  Kylie Tate ZOX:[id number] DOB: February 14, 1941 DOA: 06/09/2013 PCP: [name], MD  Assessment/Plan: Hyperglycemia without ketosis: Likely related to medication error and/or noncompliance. Chart review and discussion with family indicates that patient has had increasing difficulty managing her diabetes since her husband was placed in a nursing home.glucose remains difficult to control. CBG >300 this am. Will increase levemir to 50units and bolus with 15u now. Continue SSI. A1C 8.4 indicating poor control. 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 Ceatine continues to trend down after IV hydration. Will resume cozaar   Hyponatremia: Likely related to #1. Resolved   Dehydration: Related to #1. Resolved   Chronic pancreatitis: Stable at baseline. Continue home medications.   GERD (gastroesophageal reflux disease): Stable at baseline. Will continue her PPI   Hypertension: Fair control. Will resume cozaar.  Continue Cardizem.   Diabetes mellitus, type 2: See #1   Hyperlipidemia: We'll continue her statin. Triglycerides 782.   Hypothyroid: TSH within the limits of normal. Continue Synthroid   Depression. Stable at baseline we'll continue home meds.   Vertigo. History of same. Patient is on Valium 5 mg 3 times a day. She reports that she takes this every day. Will continue. Ambulate in hall with assists  Code Status: full Family Communication:  Disposition Plan: home when ready. likely tomorrow.    Consultants:  none  Procedures:  none  Antibiotics:  none  HPI/Subjective: Sitting up in bed eating lunch. Denies pain/discomfort  Objective: Filed Vitals:   06/11/13 0900 06/11/13 1000 06/11/13 1100 06/11/13 1200  BP: 151/69 136/60 150/73   Pulse: 96 102 99 93  Temp:      TempSrc:      Resp: 21 24 21 19    Height:      Weight:      SpO2: 98% 97% 94% 94%    Intake/Output Summary (Last 24 hours) at 06/11/13 1310 Last data filed at 06/11/13 0800  Gross per 24 hour  Intake    840 ml  Output    650 ml  Net    190 ml   Filed Weights   06/09/13 1533 06/10/13 0500 06/11/13 0500  Weight: 169 lb 8.5 oz (76.9 kg) 178 lb 5.6 oz (80.9 kg) 181 lb (82.1 kg)    Exam:   General:  Obese NAD  Cardiovascular: RRR No murmur, no gallup No LE edema  Respiratory: normal effort. BS clear to ausculation bilaterally. No wheeze no rhonchi  Abdomen: obese soft +BS non-tender to palpation no guarding  Musculoskeletal: MAE, no clubbing no cyanosis  Neuro: alert, oriented to self only, speech clear, able to make needs known.    Data Reviewed: Basic Metabolic Panel:  Recent Labs Lab 06/04/13 2207 06/08/13 1044 06/08/13 1132 06/09/13 1136 06/10/13 0412 06/11/13 0406  NA 130* 138  --  132* 136 137  K 4.3 5.1  --  4.8 3.7 3.9  CL 95* 99  --  97 104 103  CO2 23 23  --  23 25 25   GLUCOSE 501* 531*  >444* >444* 705* 217* 281*  BUN 36* 37*  --  42* 33* 30*  CREATININE 1.36* 1.90*  --  1.46* 1.33* 1.27*  CALCIUM 9.8 9.8  --  9.6 9.0 9.4   Liver Function Tests:  Recent Labs Lab 06/09/13 1136 06/10/13 0412  AST 22 19  ALT 22 19  ALKPHOS  156* 110  BILITOT 0.3 0.3  PROT 7.8 6.6  ALBUMIN 3.5 3.1*   No results found for this basename: LIPASE, AMYLASE,  in the last 168 hours No results found for this basename: AMMONIA,  in the last 168 hours CBC:  Recent Labs Lab 06/04/13 2207 06/09/13 1136 06/10/13 0412  WBC 6.5 6.9 6.2  NEUTROABS 3.9 4.7  --   HGB 11.8* 11.6* 10.7*  HCT 35.1* 34.9* 31.7*  MCV 90.7 92.1 90.1  PLT 253 246 229   Cardiac Enzymes: No results found for this basename: CKTOTAL, CKMB, CKMBINDEX, TROPONINI,  in the last 168 hours BNP (last 3 results) No results found for this basename: PROBNP,  in the last 8760 hours CBG:  Recent Labs Lab 06/10/13 1118  06/10/13 1614 06/10/13 2122 06/11/13 0721 06/11/13 1121  GLUCAP 331* 268* 220* 266* 364*    Recent Results (from the past 240 hour(s))  URINE CULTURE     Status: None   Collection Time    06/04/13 11:33 PM      Result Value Range Status   Specimen Description URINE, CLEAN CATCH   Final   Special Requests NONE   Final   Culture  Setup Time 06/05/2013 19:22   Final   Colony Count NO GROWTH   Final   Culture NO GROWTH   Final   Report Status 06/06/2013 FINAL   Final  MRSA PCR SCREENING     Status: None   Collection Time    06/09/13  4:05 PM      Result Value Range Status   MRSA by PCR NEGATIVE  NEGATIVE Final   Comment:            The GeneXpert MRSA Assay (FDA     approved for NASAL specimens     only), is one component of a     comprehensive MRSA colonization     surveillance program. It is not     intended to diagnose MRSA     infection nor to guide or     monitor treatment for     MRSA infections.     Studies: Dg Chest 2 View  06/09/2013   *RADIOLOGY REPORT*  Clinical Data: Hyperglycemia.  CHEST - 2 VIEW  Comparison: CT and plain film chest 09/22/2010.  Findings: The lungs are clear.  Heart size is normal.  No pneumothorax or pleural effusion.  IMPRESSION: No acute disease.   Original Report Authenticated By: Holley Dexter, M.D.    Scheduled Meds: . aspirin EC  81 mg Oral Daily  . atorvastatin  10 mg Oral q morning - 10a  . diazepam  5 mg Oral TID  . diltiazem  180 mg Oral q morning - 10a  . famotidine  10 mg Oral QHS  . gabapentin  100 mg Oral BID  . heparin  5,000 Units Subcutaneous Q8H  . imipramine  30 mg Oral QHS  . insulin aspart  0-15 Units Subcutaneous TID WC  . insulin aspart  0-5 Units Subcutaneous QHS  . insulin detemir  15 Units Subcutaneous Once  . [START ON 06/12/2013] insulin detemir  50 Units Subcutaneous Daily  . levothyroxine  50 mcg Oral QAC breakfast  . lipase/protease/amylase  2 capsule Oral TID  . pantoprazole  40 mg Oral Daily  . senna   1 tablet Oral BID   Continuous Infusions:   Principal Problem:   Hyperglycemia without ketosis Active Problems:   Diabetes mellitus, type 2   Hyperlipidemia   Chronic  pancreatitis   GERD (gastroesophageal reflux disease)   Hypertension   Hyponatremia   Dehydration   Acute on chronic renal failure    Time spent: 30 minutes    [facility] M  [facility] Pager [phone]. If 7PM-7AM, please contact night-coverage at [email or web address], password [facility] 06/11/2013, 1:10 PM  LOS: 2 days

## 2013-06-12 DIAGNOSIS — N183 Chronic kidney disease, stage 3 unspecified: Secondary | ICD-10-CM

## 2013-06-12 LAB — BASIC METABOLIC PANEL
CO2: 24 mEq/L (ref 19–32)
Chloride: 107 mEq/L (ref 96–112)
Creatinine, Ser: 1.34 mg/dL — ABNORMAL HIGH (ref 0.50–1.10)
GFR calc Af Amer: 45 mL/min — ABNORMAL LOW (ref >90)
Potassium: 4.2 mEq/L (ref 3.5–5.1)

## 2013-06-12 LAB — GLUCOSE, CAPILLARY: Glucose-Capillary: 226 mg/dL — ABNORMAL HIGH (ref 70–99)

## 2013-06-12 MED ORDER — INSULIN ASPART 100 UNIT/ML ~~LOC~~ SOLN: 5.0000 [IU] | Freq: Three times a day (TID) | SUBCUTANEOUS | Status: DC

## 2013-06-12 MED ORDER — INSULIN ASPART 100 UNIT/ML ~~LOC~~ SOLN: 10.0000 [IU] | Freq: Three times a day (TID) | SUBCUTANEOUS | Status: DC

## 2013-06-12 MED ORDER — INSULIN DETEMIR 100 UNIT/ML ~~LOC~~ SOLN: 55.0000 [IU] | Freq: Every day | SUBCUTANEOUS | Status: DC

## 2013-06-12 MED ORDER — INSULIN ASPART 100 UNIT/ML ~~LOC~~ SOLN
6.0000 [IU] | Freq: Three times a day (TID) | SUBCUTANEOUS | Status: DC
  Administered 2013-06-12: 6 [IU] via SUBCUTANEOUS

## 2013-06-12 MED ORDER — INSULIN ASPART 100 UNIT/ML ~~LOC~~ SOLN
0.0000 [IU] | Freq: Three times a day (TID) | SUBCUTANEOUS | Status: DC
  Administered 2013-06-12: 20 [IU] via SUBCUTANEOUS

## 2013-06-12 MED ORDER — INSULIN DETEMIR 100 UNIT/ML ~~LOC~~ SOLN
55.0000 [IU] | Freq: Every day | SUBCUTANEOUS | Status: DC
  Administered 2013-06-12: 55 [IU] via SUBCUTANEOUS
  Filled 2013-06-12 (×3): qty 0.55

## 2013-06-12 MED ORDER — INSULIN ASPART 100 UNIT/ML ~~LOC~~ SOLN: 0.0000 [IU] | Freq: Every day | SUBCUTANEOUS | Status: DC

## 2013-06-12 NOTE — Progress Notes (Signed)
Pt. D/c papers provided, education provided about change in insulin, pt. Provided with handouts about diabetes, insulin, and diet. IV removed, pt. To go home with advance following up.

## 2013-06-12 NOTE — Discharge Summary (Addendum)
Physician Discharge Summary  Kylie Tate AVW:[id number] DOB: 23-Feb-1941 DOA: 06/09/2013  PCP: [name], MD  Admit date: 06/09/2013 Discharge date: 06/12/2013  Recommendations for Outpatient Follow-up:  1. Followup hyperglycemia, diabetes mellitus. 2. Followup home health RN for disease management  Follow-up Information   Follow up with [facility].   Contact information:   [address] [place] [address] [phone]      Follow up with [name], MD In 1 week.   Contact information:   [address]  [address] [place] [address] [phone]       Follow up with NIDA,GEBRESELASSIE, MD In 2 weeks.   Contact information:   [address] [place] [address] [phone]      Discharge Diagnoses:  1. Hyperglycemia without ketosis 2. Acute renal failure superimposed on chronic kidney disease stage III 3. Diabetes mellitus type 2, uncontrolled by hemoglobin A1c  Discharge Condition: Improved Disposition: Home with home health RN for disease management  Diet recommendation: Diabetic  Filed Weights   06/09/13 1533 06/10/13 0500 06/11/13 0500  Weight: 76.9 kg (169 lb 8.5 oz) 80.9 kg (178 lb 5.6 oz) 82.1 kg (181 lb)    History of present illness:  72 year old woman with a history of diabetes with very poor control recently superimposed on more chronic difficulties since her husband was placed in a nursing home 6 months ago. She has had difficulty keeping up with her insulin regimen. In emergency department blood sugar was over 700 and she was admitted for stabilization of glucose.   Hospital Course:  [name] was placed on IV insulin infusion and admitted. Her blood sugar gradually was controlled and she was transitioned to subcutaneous insulin. Her blood sugars are now stable on her dose of outpatient long-acting insulin to be at somewhat high. Patient was evaluated by physical therapy and no followup was recommended. She is active with [facility]  care management and she declined ALF placement. The family and neighbors are involved in her care. She will go home with home health RN. Acute renal failure resolved with IV fluids and discontinuation of Relafen.  1. Hyperglycemia without ketosis: Likely secondary to noncompliance with insulin regimen (not intentional). Blood sugars have stabilized. She will return home on Levemir with close outpatient followup.  2. Acute renal failure superimposed on chronic kidney disease stage III: Acute component resolved with IV fluids. Likely secondary to fluid losses from severe hyperglycemia complicated by daily Relafen and Cozaar. 3. Diabetes mellitus, uncontrolled by hemoglobin A1c: Plan as above. 4. Disposition: Patient was evaluated by physical therapy and no followup was recommended. She is active with [facility] care management and she declined ALF placement. The family and neighbors are involved in her care. She will go home with home health RN.  Consultants:  Physical therapy: No followup needed. Procedures:  None    Discharge Instructions  Discharge Orders   Future Appointments Provider Department Dept Phone   07/08/2013 10:00 AM [facility] [phone]   Future Orders Complete By Expires     Activity as tolerated - No restrictions  As directed     Diet Carb Modified  As directed     Discharge instructions  As directed     Comments:      Closely monitor your blood sugars, check at least twice daily, keep log book and bring to all your appointments. Note dosage changes to insulin regimen. Call your physician or seek immediate medical attention for  blood sugar less than 70 or greater than 400.        Medication List    STOP taking these medications       nabumetone 500 MG tablet  Commonly known as:  RELAFEN      TAKE these medications       aspirin EC 81 MG tablet  Take 81 mg by mouth daily. For Blood Thinner     atorvastatin 10 MG tablet   Commonly known as:  LIPITOR  Take 10 mg by mouth every morning. For Cholesterol     CENTRUM PO  Take 1 tablet by mouth daily.     CREON 12000 UNITS Cpep  Generic drug:  lipase/protease/amylase  Take 2 capsules by mouth 3 (three) times daily. Takes 2 capsules with meals and 1 with snacks.     diazepam 5 MG tablet  Commonly known as:  VALIUM  Take 5 mg by mouth 3 (three) times daily.     diltiazem 180 MG 24 hr capsule  Commonly known as:  CARDIZEM CD  Take 180 mg by mouth every morning. For Blood Pressure     fish oil-omega-3 fatty acids 1000 MG capsule  Take 1 g by mouth daily.     gabapentin 100 MG capsule  Commonly known as:  NEURONTIN  Take 100 mg by mouth 2 (two) times daily. Neuropathy     imipramine 10 MG tablet  Commonly known as:  TOFRANIL  Take 30 mg by mouth at bedtime.     insulin aspart 100 UNIT/ML injection  Commonly known as:  NOVOLOG FLEXPEN  Inject 10 Units into the skin 3 (three) times daily before meals.     insulin detemir 100 UNIT/ML injection  Commonly known as:  LEVEMIR  Inject 0.55 mLs (55 Units total) into the skin daily. First dose 10 AM tomorrow 7/27.     levothyroxine 50 MCG tablet  Commonly known as:  SYNTHROID, LEVOTHROID  Take 50 mcg by mouth daily. For THYROID     losartan 50 MG tablet  Commonly known as:  COZAAR  Take 50 mg by mouth daily.     omeprazole 20 MG capsule  Commonly known as:  PRILOSEC  Take 20 mg by mouth daily. For Acid Reflux/GERD     ranitidine 150 MG tablet  Commonly known as:  ZANTAC  Take 150 mg by mouth daily.       No Known Allergies  The results of significant diagnostics from this hospitalization (including imaging, microbiology, ancillary and laboratory) are listed below for reference.    Significant Diagnostic Studies: Dg Chest 2 View  06/09/2013   *RADIOLOGY REPORT*  Clinical Data: Hyperglycemia.  CHEST - 2 VIEW  Comparison: CT and plain film chest 09/22/2010.  Findings: The lungs are clear.  Heart  size is normal.  No pneumothorax or pleural effusion.  IMPRESSION: No acute disease.   Original Report Authenticated By: Holley Dexter, M.D.    Microbiology: Recent Results (from the past 240 hour(s))  URINE CULTURE     Status: None   Collection Time    06/04/13 11:33 PM      Result Value Range Status   Specimen Description URINE, CLEAN CATCH   Final   Special Requests NONE   Final   Culture  Setup Time 06/05/2013 19:22   Final   Colony Count NO GROWTH   Final   Culture NO GROWTH   Final   Report Status 06/06/2013 FINAL   Final  MRSA PCR SCREENING  Status: None   Collection Time    06/09/13  4:05 PM      Result Value Range Status   MRSA by PCR NEGATIVE  NEGATIVE Final   Comment:            The GeneXpert MRSA Assay (FDA     approved for NASAL specimens     only), is one component of a     comprehensive MRSA colonization     surveillance program. It is not     intended to diagnose MRSA     infection nor to guide or     monitor treatment for     MRSA infections.     Labs: Basic Metabolic Panel:  Recent Labs Lab 06/08/13 1044 06/08/13 1132 06/09/13 1136 06/10/13 0412 06/11/13 0406 06/12/13 0535  NA 138  --  132* 136 137 141  K 5.1  --  4.8 3.7 3.9 4.2  CL 99  --  97 104 103 107  CO2 23  --  23 25 25 24   GLUCOSE 531*  >444* >444* 705* 217* 281* 237*  BUN 37*  --  42* 33* 30* 30*  CREATININE 1.90*  --  1.46* 1.33* 1.27* 1.34*  CALCIUM 9.8  --  9.6 9.0 9.4 9.8   Liver Function Tests:  Recent Labs Lab 06/09/13 1136 06/10/13 0412  AST 22 19  ALT 22 19  ALKPHOS 156* 110  BILITOT 0.3 0.3  PROT 7.8 6.6  ALBUMIN 3.5 3.1*   CBC:  Recent Labs Lab 06/09/13 1136 06/10/13 0412  WBC 6.9 6.2  NEUTROABS 4.7  --   HGB 11.6* 10.7*  HCT 34.9* 31.7*  MCV 92.1 90.1  PLT 246 229   CBG:  Recent Labs Lab 06/11/13 0721 06/11/13 1121 06/11/13 1612 06/11/13 2101 06/12/13 0710  GLUCAP 266* 364* 294* 274* 226*    Principal Problem:   Hyperglycemia  without ketosis Active Problems:   Diabetes mellitus, type 2   Hyperlipidemia   Chronic pancreatitis   GERD (gastroesophageal reflux disease)   Hypertension   Hyponatremia   Dehydration   Acute on chronic renal failure   Time coordinating discharge: 35 minutes  Signed:  [name], MD [facility] 06/12/2013, 9:13 AM

## 2013-06-12 NOTE — Progress Notes (Signed)
[facility] PROGRESS NOTE  Kylie Tate NWG:[id number] DOB: 10/18/1941 DOA: 06/09/2013 PCP: [name], MD Endocrinologist: Dr. Fransico Him  Assessment/Plan: 1. Hyperglycemia without ketosis: Likely secondary to noncompliance with insulin regimen (not intentional). Blood sugars have stabilized. She will return home on Levemir with close outpatient followup.  2. Acute renal failure superimposed on chronic kidney disease stage III: Acute component resolved with IV fluids. Likely secondary to fluid losses from severe hyperglycemia complicated by daily Relafen and Cozaar. 3. Diabetes mellitus, uncontrolled by hemoglobin A1c: Plan as above. 4. Disposition: Patient was evaluated by physical therapy and no followup was recommended. She is active with [facility] care management and she declined ALF placement. The family and neighbors are involved in her care. She will go home with home health RN.   Home today   Code Status: Full code  DVT prophylaxis: Heparin  Family Communication:  Disposition Plan: As above   [name], MD  [facility]  Pager [phone] If 7PM-7AM, please contact night-coverage at [email or web address], password [facility] 06/12/2013, 8:57 AM  LOS: 3 days   Clinical Summary: 72 year old woman with a history of diabetes with very poor control recently superimposed on more chronic difficulties since her husband was placed in a nursing home 6 months ago. She has had difficulty keeping up with her insulin regimen. In emergency department blood sugar was over 700 and she was admitted for stabilization of glucose.   Consultants:  Physical therapy: No followup needed.  Procedures:  None   HPI/Subjective: No complaints. She feels better. Eating well. No pain.  Objective: Filed Vitals:   06/11/13 1100 06/11/13 1200 06/11/13 1900 06/12/13 0500  BP: 150/73  154/89 130/81  Pulse: 99 93 85 82  Temp:   97.9 F (36.6 C) 97.4 F (36.3 C)  TempSrc:   Oral Oral  Resp: 21 19 20  20   Height:      Weight:      SpO2: 94% 94% 97% 92%    Intake/Output Summary (Last 24 hours) at 06/12/13 0857 Last data filed at 06/12/13 0500  Gross per 24 hour  Intake    960 ml  Output      0 ml  Net    960 ml     Filed Weights   06/09/13 1533 06/10/13 0500 06/11/13 0500  Weight: 76.9 kg (169 lb 8.5 oz) 80.9 kg (178 lb 5.6 oz) 82.1 kg (181 lb)    Exam:   Afebrile, vital signs stable.  Cardiovascular: Regular rate and rhythm. No murmur, rub, gallop. No significant lower extremity edema.  Respiratory: Clear to auscultation bilaterally. No wheezes, rales, rhonchi. Normal respiratory effort.  Psychiatric: Grossly normal mood and affect. Speech fluent and appropriate.  General: Appears calm and comfortable.  Data Reviewed:  Capillary blood sugars better controlled today. Fasting blood sugar 226.  Basic metabolic panel reveals stabilization of BUN and creatinine. Appears to be at baseline by review of data dating back to 2011  Pending studies:   None  Scheduled Meds: . aspirin EC  81 mg Oral Daily  . atorvastatin  10 mg Oral q morning - 10a  . diazepam  5 mg Oral TID  . diltiazem  180 mg Oral q morning - 10a  . famotidine  10 mg Oral QHS  . gabapentin  100 mg Oral BID  . heparin  5,000 Units Subcutaneous Q8H  . imipramine  30 mg Oral QHS  . insulin aspart  0-15 Units Subcutaneous TID WC  . insulin aspart  0-5 Units  Subcutaneous QHS  . insulin detemir  50 Units Subcutaneous Daily  . levothyroxine  50 mcg Oral QAC breakfast  . lipase/protease/amylase  2 capsule Oral TID  . pantoprazole  40 mg Oral Daily  . senna  1 tablet Oral BID   Continuous Infusions:   Principal Problem:   Hyperglycemia without ketosis Active Problems:   Diabetes mellitus, type 2   Hyperlipidemia   Chronic pancreatitis   GERD (gastroesophageal reflux disease)   Hypertension   Hyponatremia   Dehydration   Acute on chronic renal failure

## 2013-06-14 DIAGNOSIS — N183 Chronic kidney disease, stage 3 unspecified: Secondary | ICD-10-CM

## 2013-06-14 DIAGNOSIS — D638 Anemia in other chronic diseases classified elsewhere: Secondary | ICD-10-CM

## 2013-06-14 DIAGNOSIS — E1165 Type 2 diabetes mellitus with hyperglycemia: Secondary | ICD-10-CM

## 2013-06-14 DIAGNOSIS — IMO0001 Reserved for inherently not codable concepts without codable children: Secondary | ICD-10-CM

## 2013-06-14 DIAGNOSIS — I129 Hypertensive chronic kidney disease with stage 1 through stage 4 chronic kidney disease, or unspecified chronic kidney disease: Secondary | ICD-10-CM

## 2013-06-15 ENCOUNTER — Encounter (HOSPITAL_BASED_OUTPATIENT_CLINIC_OR_DEPARTMENT_OTHER): Payer: Self-pay | Admitting: Emergency Medicine

## 2013-06-15 ENCOUNTER — Emergency Department (HOSPITAL_BASED_OUTPATIENT_CLINIC_OR_DEPARTMENT_OTHER)
Admission: EM | Admit: 2013-06-15 | Discharge: 2013-06-16 | Disposition: A | Discharge status: Home / Self Care | Payer: Medicare Other | Attending: Emergency Medicine | Admitting: Emergency Medicine

## 2013-06-15 DIAGNOSIS — F329 Major depressive disorder, single episode, unspecified: Secondary | ICD-10-CM | POA: Insufficient documentation

## 2013-06-15 DIAGNOSIS — E119 Type 2 diabetes mellitus without complications: Secondary | ICD-10-CM | POA: Insufficient documentation

## 2013-06-15 DIAGNOSIS — N390 Urinary tract infection, site not specified: Secondary | ICD-10-CM | POA: Insufficient documentation

## 2013-06-15 DIAGNOSIS — R739 Hyperglycemia, unspecified: Secondary | ICD-10-CM

## 2013-06-15 DIAGNOSIS — Z7982 Long term (current) use of aspirin: Secondary | ICD-10-CM | POA: Insufficient documentation

## 2013-06-15 DIAGNOSIS — Z862 Personal history of diseases of the blood and blood-forming organs and certain disorders involving the immune mechanism: Secondary | ICD-10-CM | POA: Insufficient documentation

## 2013-06-15 DIAGNOSIS — E785 Hyperlipidemia, unspecified: Secondary | ICD-10-CM | POA: Insufficient documentation

## 2013-06-15 DIAGNOSIS — F3289 Other specified depressive episodes: Secondary | ICD-10-CM | POA: Insufficient documentation

## 2013-06-15 DIAGNOSIS — N189 Chronic kidney disease, unspecified: Secondary | ICD-10-CM | POA: Insufficient documentation

## 2013-06-15 DIAGNOSIS — E079 Disorder of thyroid, unspecified: Secondary | ICD-10-CM | POA: Insufficient documentation

## 2013-06-15 DIAGNOSIS — E1169 Type 2 diabetes mellitus with other specified complication: Secondary | ICD-10-CM | POA: Insufficient documentation

## 2013-06-15 DIAGNOSIS — Z8719 Personal history of other diseases of the digestive system: Secondary | ICD-10-CM | POA: Insufficient documentation

## 2013-06-15 DIAGNOSIS — Z79899 Other long term (current) drug therapy: Secondary | ICD-10-CM | POA: Insufficient documentation

## 2013-06-15 DIAGNOSIS — I129 Hypertensive chronic kidney disease with stage 1 through stage 4 chronic kidney disease, or unspecified chronic kidney disease: Secondary | ICD-10-CM | POA: Insufficient documentation

## 2013-06-15 DIAGNOSIS — Z794 Long term (current) use of insulin: Secondary | ICD-10-CM | POA: Insufficient documentation

## 2013-06-15 DIAGNOSIS — K219 Gastro-esophageal reflux disease without esophagitis: Secondary | ICD-10-CM | POA: Insufficient documentation

## 2013-06-15 LAB — CBC WITH DIFFERENTIAL/PLATELET
Basophils Absolute: 0 10*3/uL (ref 0.0–0.1)
Eosinophils Absolute: 0.1 10*3/uL (ref 0.0–0.7)
Lymphocytes Relative: 36 % (ref 12–46)
Lymphs Abs: 2.4 10*3/uL (ref 0.7–4.0)
Neutrophils Relative %: 55 % (ref 43–77)
Platelets: 223 10*3/uL (ref 150–400)
RBC: 3.71 MIL/uL — ABNORMAL LOW (ref 3.87–5.11)
RDW: 12.4 % (ref 11.5–15.5)
WBC: 6.8 10*3/uL (ref 4.0–10.5)

## 2013-06-15 LAB — GLUCOSE, CAPILLARY

## 2013-06-15 LAB — URINALYSIS, ROUTINE W REFLEX MICROSCOPIC
Leukocytes, UA: NEGATIVE
Nitrite: NEGATIVE
Specific Gravity, Urine: 1.013 (ref 1.005–1.030)
Urobilinogen, UA: 0.2 mg/dL (ref 0.0–1.0)

## 2013-06-15 LAB — URINE MICROSCOPIC-ADD ON

## 2013-06-15 MED ORDER — SODIUM CHLORIDE 0.9 % IV BOLUS (SEPSIS)
1000.0000 mL | Freq: Once | INTRAVENOUS | Status: AC
  Administered 2013-06-15: 1000 mL via INTRAVENOUS

## 2013-06-15 NOTE — ED Notes (Signed)
Pt states that she started feeling dizzy and her feet started burning this evening, so pt checked her sugar and at home it was 600, right now it is 493, pt cont. With dizziness, denies n/v

## 2013-06-15 NOTE — ED Notes (Signed)
Pt currently has hh RN 3 times a week

## 2013-06-15 NOTE — ED Notes (Signed)
Pt reports that she was hospitalized 1 week ago, at time of discharge pt's novolog insulin was decreased and since d/c glucose has been steadily increasing, denies recent infection or change in diet

## 2013-06-16 ENCOUNTER — Encounter: Payer: Self-pay | Admitting: Family Medicine

## 2013-06-16 LAB — COMPREHENSIVE METABOLIC PANEL
ALT: 30 U/L (ref 0–35)
AST: 28 U/L (ref 0–37)
Alkaline Phosphatase: 156 U/L — ABNORMAL HIGH (ref 39–117)
GFR calc Af Amer: 43 mL/min — ABNORMAL LOW (ref >90)
Glucose, Bld: 515 mg/dL — ABNORMAL HIGH (ref 70–99)
Potassium: 4.6 mEq/L (ref 3.5–5.1)
Sodium: 132 mEq/L — ABNORMAL LOW (ref 135–145)
Total Protein: 7.6 g/dL (ref 6.0–8.3)

## 2013-06-16 LAB — URINE CULTURE
Colony Count: NO GROWTH
Culture: NO GROWTH

## 2013-06-16 LAB — GLUCOSE, CAPILLARY: Glucose-Capillary: 320 mg/dL — ABNORMAL HIGH (ref 70–99)

## 2013-06-16 MED ORDER — DEXTROSE 5 % IV SOLN: 1.0000 g | Freq: Once | INTRAVENOUS | Status: AC

## 2013-06-16 MED ORDER — INSULIN REGULAR HUMAN 100 UNIT/ML IJ SOLN
INTRAMUSCULAR | Status: AC
  Administered 2013-06-16: 4 [IU] via SUBCUTANEOUS
  Filled 2013-06-16: qty 1

## 2013-06-16 MED ORDER — DEXTROSE 5 % IV SOLN
INTRAVENOUS | Status: AC
  Administered 2013-06-16: 1 g via INTRAVENOUS
  Filled 2013-06-16: qty 10

## 2013-06-16 MED ORDER — INSULIN REGULAR HUMAN 100 UNIT/ML IJ SOLN: 4.0000 [IU] | Freq: Once | INTRAMUSCULAR | Status: DC

## 2013-06-16 MED ORDER — INSULIN REGULAR HUMAN 100 UNIT/ML IJ SOLN
3.0000 [IU] | Freq: Once | INTRAMUSCULAR | Status: AC
  Administered 2013-06-16: 3 [IU] via SUBCUTANEOUS

## 2013-06-16 MED ORDER — INSULIN REGULAR HUMAN 100 UNIT/ML IJ SOLN: 4.0000 [IU] | Freq: Once | INTRAMUSCULAR | Status: AC

## 2013-06-16 MED ORDER — CEPHALEXIN 500 MG PO CAPS: 500.0000 mg | ORAL_CAPSULE | Freq: Four times a day (QID) | ORAL | Status: DC

## 2013-06-16 MED ORDER — INSULIN REGULAR HUMAN 100 UNIT/ML IJ SOLN
5.0000 [IU] | Freq: Once | INTRAMUSCULAR | Status: AC
  Administered 2013-06-16: 5 [IU] via INTRAVENOUS

## 2013-06-16 NOTE — ED Notes (Signed)
C/o dizzinessand blood sugar >600 today. Recent admission for same at [facility]

## 2013-06-16 NOTE — ED Notes (Signed)
rx x 1 for keflex. D/c home with ride. Verbalizes that she will call her endocrinologist for appt in the am

## 2013-06-16 NOTE — ED Provider Notes (Signed)
CSN: [id number]     Arrival date & time 06/15/13  2230 History     First MD Initiated Contact with Patient 06/16/13 0053     Chief Complaint  Patient presents with  . Hyperglycemia   (Consider location/radiation/quality/duration/timing/severity/associated sxs/prior Treatment) Patient is a 72 y.o. female presenting with hyperglycemia. The history is provided by the patient.  Hyperglycemia Blood sugar level PTA:  600 Severity:  Moderate Onset quality:  Gradual Timing:  Constant Progression:  Unchanged Chronicity:  Chronic Diabetes status:  Controlled with insulin Context: change in medication   Relieved by:  Nothing Ineffective treatments:  None tried Associated symptoms: no altered mental status, no dysuria, no fever, no nausea, no polyuria and no shortness of breath   Risk factors: no recent steroid use     Past Medical History  Diagnosis Date  . Diabetes mellitus   . Hypertension   . GERD (gastroesophageal reflux disease)   . Hyperlipidemia   . Diverticulitis   . Thyroid disease     multinodular goiter  . Chronic pancreatitis   . Depression   . CRI (chronic renal insufficiency)   . Anemia, chronic disease   . Vertigo   . Diverticulosis    Past Surgical History  Procedure Laterality Date  . Nissen fundoplication    . Abdominal exploration surgery      abd pain-diverticulitis  . Colonoscopy  06/12/2000    Brodie-diffuse melanosis coli/mucousy exudate throughtout colon s/p bx (benign)  . Cholecystectomy  2001  . Colonoscopy with esophagogastroduodenoscopy (egd) N/A 03/10/2013    Procedure: COLONOSCOPY WITH ESOPHAGOGASTRODUODENOSCOPY (EGD);  Surgeon: West Bali, MD;  [facility];  Service: Endoscopy;  Laterality: N/A;  9:45  . Maloney dilation N/A 03/10/2013    Procedure: [name] DILATION;  Surgeon: West Bali, MD;  Location: [facility];  Service: Endoscopy;  Laterality: N/A;  . Savory dilation N/A 03/10/2013    Procedure: SAVORY DILATION;   Surgeon: West Bali, MD;  Location: [facility];  Service: Endoscopy;  Laterality: N/A;  . Colonoscopy N/A 04/26/2013    Procedure: COLONOSCOPY;  Surgeon: West Bali, MD;  Location: [facility];  Service: Endoscopy;  Laterality: N/A;  9:30-rescheduled to 8:30 Pt aware of new time per Durward Mallard   Family History  Problem Relation Age of Onset  . Heart disease    . Arthritis    . Cancer Brother     prostate  . Diabetes    . Heart disease Mother   . Hypertension Mother   . Hyperlipidemia Mother   . Diabetes Mother   . Depression Father   . Diabetes Father   . Diabetes Sister   . Colon cancer Neg Hx   . Pancreatitis Neg Hx    History  Substance Use Topics  . Smoking status: Never Smoker   . Smokeless tobacco: Not on file  . Alcohol Use: No   OB History   Grav Para Term Preterm Abortions TAB SAB Ect Mult Living                 Review of Systems  Constitutional: Negative for fever.  Respiratory: Negative for shortness of breath.   Gastrointestinal: Negative for nausea.  Endocrine: Negative for polyuria.  Genitourinary: Negative for dysuria.  Psychiatric/Behavioral: Negative for altered mental status.  All other systems reviewed and are negative.    Allergies  Review of patient's allergies indicates no known allergies.  Home Medications   Current Outpatient Rx  Name  Route  Sig  Dispense  Refill  . insulin aspart (NOVOLOG FLEXPEN) 100 UNIT/ML injection   Subcutaneous   Inject 10 Units into the skin 3 (three) times daily before meals.         . insulin detemir (LEVEMIR) 100 UNIT/ML injection   Subcutaneous   Inject 0.55 mLs (55 Units total) into the skin daily. First dose 10 AM tomorrow 7/27.         Marland Kitchen aspirin EC 81 MG tablet   Oral   Take 81 mg by mouth daily. For Blood Thinner         . atorvastatin (LIPITOR) 10 MG tablet   Oral   Take 10 mg by mouth every morning. For Cholesterol         . diazepam (VALIUM) 5 MG tablet   Oral   Take 5 mg  by mouth 3 (three) times daily.         Marland Kitchen diltiazem (CARDIZEM CD) 180 MG 24 hr capsule   Oral   Take 180 mg by mouth every morning. For Blood Pressure         . fish oil-omega-3 fatty acids 1000 MG capsule   Oral   Take 1 g by mouth daily.         Marland Kitchen gabapentin (NEURONTIN) 100 MG capsule   Oral   Take 100 mg by mouth 2 (two) times daily. Neuropathy         . imipramine (TOFRANIL) 10 MG tablet   Oral   Take 30 mg by mouth at bedtime.         Marland Kitchen levothyroxine (SYNTHROID, LEVOTHROID) 50 MCG tablet   Oral   Take 50 mcg by mouth daily. For THYROID         . lipase/protease/amylase (CREON) 12000 UNITS CPEP   Oral   Take 2 capsules by mouth 3 (three) times daily. Takes 2 capsules with meals and 1 with snacks.         Marland Kitchen losartan (COZAAR) 50 MG tablet   Oral   Take 50 mg by mouth daily.         . Multiple Vitamins-Minerals (CENTRUM PO)   Oral   Take 1 tablet by mouth daily.          Marland Kitchen omeprazole (PRILOSEC) 20 MG capsule   Oral   Take 20 mg by mouth daily. For Acid Reflux/GERD         . ranitidine (ZANTAC) 150 MG tablet   Oral   Take 150 mg by mouth daily.          BP 142/60  Pulse 80  Temp(Src) 97.9 F (36.6 C) (Oral)  Resp 20  Ht 5\' 4"  (1.626 m)  Wt 170 lb (77.111 kg)  BMI 29.17 kg/m2  SpO2 96% Physical Exam  Constitutional: She is oriented to person, place, and time. She appears well-developed and well-nourished. No distress.  HENT:  Head: Normocephalic and atraumatic.  Mouth/Throat: Oropharynx is clear and moist.  Eyes: Conjunctivae are normal. Pupils are equal, round, and reactive to light.  Neck: Normal range of motion. Neck supple.  Cardiovascular: Normal rate, regular rhythm and intact distal pulses.   Pulmonary/Chest: Effort normal and breath sounds normal. She has no wheezes. She has no rales.  Abdominal: Soft. Bowel sounds are normal. There is no tenderness. There is no rebound and no guarding.  Musculoskeletal: Normal range of  motion.  Neurological: She is alert and oriented to person, place, and time.  Skin: Skin  is warm and dry.  Psychiatric: She has a normal mood and affect.    ED Course   Procedures (including critical care time)  Labs Reviewed  GLUCOSE, CAPILLARY - Abnormal; Notable for the following:    Glucose-Capillary 493 (*)    All other components within normal limits  URINALYSIS, ROUTINE W REFLEX MICROSCOPIC - Abnormal; Notable for the following:    Glucose, UA >1000 (*)    Protein, ur 30 (*)    All other components within normal limits  URINE MICROSCOPIC-ADD ON - Abnormal; Notable for the following:    Squamous Epithelial / LPF FEW (*)    Bacteria, UA MANY (*)    All other components within normal limits  CBC WITH DIFFERENTIAL - Abnormal; Notable for the following:    RBC 3.71 (*)    Hemoglobin 11.2 (*)    HCT 33.8 (*)    All other components within normal limits  COMPREHENSIVE METABOLIC PANEL - Abnormal; Notable for the following:    Sodium 132 (*)    Glucose, Bld 515 (*)    BUN 41 (*)    Creatinine, Ser 1.40 (*)    Alkaline Phosphatase 156 (*)    GFR calc non Af Amer 37 (*)    GFR calc Af Amer 43 (*)    All other components within normal limits  GLUCOSE, CAPILLARY - Abnormal; Notable for the following:    Glucose-Capillary 402 (*)    All other components within normal limits  GLUCOSE, CAPILLARY - Abnormal; Notable for the following:    Glucose-Capillary 408 (*)    All other components within normal limits  GLUCOSE, CAPILLARY - Abnormal; Notable for the following:    Glucose-Capillary 377 (*)    All other components within normal limits  URINE CULTURE   No results found. No diagnosis found.  MDM  Suspect patient is not taking meds as should due to stress over husband improving glucose will treat for UTI.  Call your doctor in am.  Sugar checks 4 times a day and and follow up tomorrow  Lively Haberman Smitty Cords, MD 06/16/13 1610

## 2013-06-16 NOTE — Progress Notes (Signed)
Patient ID: Kylie Tate, female   DOB: 10/28/1941, 72 y.o.   MRN: [id number]  I spoke with Selena Batten, RN at Dr. Fransico Him office, they are in contact with the [name] and her daughter and will adjust medication as needed They have also left messge for [facility] [name] is current staying with her daughter

## 2013-06-22 ENCOUNTER — Encounter: Payer: Self-pay | Admitting: Family Medicine

## 2013-06-28 ENCOUNTER — Other Ambulatory Visit: Payer: Self-pay | Admitting: Family Medicine

## 2013-06-28 NOTE — Telephone Encounter (Signed)
OK refill?? 

## 2013-07-05 ENCOUNTER — Other Ambulatory Visit: Payer: Self-pay | Admitting: Family Medicine

## 2013-07-07 ENCOUNTER — Encounter: Payer: Self-pay | Admitting: Gastroenterology

## 2013-07-08 ENCOUNTER — Ambulatory Visit: Payer: Medicare Other | Admitting: Gastroenterology

## 2013-07-08 ENCOUNTER — Ambulatory Visit (INDEPENDENT_AMBULATORY_CARE_PROVIDER_SITE_OTHER): Payer: Medicare Other | Admitting: Gastroenterology

## 2013-07-08 ENCOUNTER — Encounter: Payer: Self-pay | Admitting: Gastroenterology

## 2013-07-08 VITALS — BP 118/59 | HR 88 | Temp 98.4°F | Ht 64.0 in | Wt 163.2 lb

## 2013-07-08 DIAGNOSIS — K219 Gastro-esophageal reflux disease without esophagitis: Secondary | ICD-10-CM

## 2013-07-08 DIAGNOSIS — R1310 Dysphagia, unspecified: Secondary | ICD-10-CM

## 2013-07-08 DIAGNOSIS — K861 Other chronic pancreatitis: Secondary | ICD-10-CM

## 2013-07-08 DIAGNOSIS — R1314 Dysphagia, pharyngoesophageal phase: Secondary | ICD-10-CM

## 2013-07-08 NOTE — Progress Notes (Signed)
Primary Care Physician: [name], MD  Primary Gastroenterologist:  Jonette Eva, MD   Chief Complaint  Patient presents with  . Follow-up    HPI: Kylie Tate is a 72 y.o. female here for followup visit. She has a history of chronic GERD and chronic pancreatitis. EGD in April of this year showed erosive reflux esophagitis and erosive gastritis (felt to be secondary to Relafen and aspirin). Colonoscopy was incomplete at that time. In June she had repeat colonoscopy that showed mild diverticulosis of the sigmoid colon, small internal hemorrhoids, redundant colon. Colonoscopy with overtube in 10-15 years if benefits outweigh the risk.  No problems swallowing pills. Solid food dysphagia, feels like getting stuck in throat area. Takes whole bottle of water to wash it down. No abdominal pain. BM fine. No melena, brbpr. Still on Relafen, ASA. Takes omeprazole daily and zantac daily.  Current Outpatient Prescriptions  Medication Sig Dispense Refill  . aspirin EC 81 MG tablet Take 81 mg by mouth daily. For Blood Thinner      . atorvastatin (LIPITOR) 10 MG tablet Take 10 mg by mouth every morning. For Cholesterol      . diazepam (VALIUM) 5 MG tablet Take 5 mg by mouth 3 (three) times daily.      Marland Kitchen diltiazem (CARDIZEM CD) 180 MG 24 hr capsule Take 180 mg by mouth every morning. For Blood Pressure      . fish oil-omega-3 fatty acids 1000 MG capsule Take 1 g by mouth daily.      Marland Kitchen gabapentin (NEURONTIN) 100 MG capsule Take 100 mg by mouth 2 (two) times daily. Neuropathy      . imipramine (TOFRANIL) 10 MG tablet TAKE 3 TABLETS (30 MG TOTAL)  BY MOUTH AT BEDTIME FOR SLEEP/MOOD OR ASDIRECTED BY PHYSICIAN  90 tablet  2  . insulin aspart (NOVOLOG) 100 UNIT/ML injection Inject 15 Units into the skin 3 (three) times daily before meals.      . insulin detemir (LEVEMIR) 100 UNIT/ML injection Inject 60 Units into the skin at bedtime. First dose 10 AM tomorrow 7/27.      Marland Kitchen levothyroxine (SYNTHROID,  LEVOTHROID) 50 MCG tablet Take 50 mcg by mouth daily. For THYROID      . lipase/protease/amylase (CREON) 12000 UNITS CPEP Take 2 capsules by mouth 3 (three) times daily. Takes 2 capsules with meals and 1 with snacks.      Marland Kitchen losartan (COZAAR) 50 MG tablet Take 50 mg by mouth daily.      . Multiple Vitamins-Minerals (CENTRUM PO) Take 1 tablet by mouth daily.       . nabumetone (RELAFEN) 500 MG tablet Take 500 mg by mouth 2 (two) times daily.       Marland Kitchen omeprazole (PRILOSEC) 20 MG capsule Take 20 mg by mouth daily. For Acid Reflux/GERD      . ranitidine (ZANTAC) 150 MG tablet Take 150 mg by mouth daily.       No current facility-administered medications for this visit.    Allergies as of 07/08/2013  . (No Known Allergies)    ROS:  General: Negative for anorexia, weight loss, fever, chills, fatigue, weakness. ENT: Negative for hoarseness,  nasal congestion. CV: Negative for chest pain, angina, palpitations, dyspnea on exertion, peripheral edema.  Respiratory: Negative for dyspnea at rest, dyspnea on exertion, cough, sputum, wheezing.  GI: See history of present illness. GU:  Negative for dysuria, hematuria, urinary incontinence, urinary frequency, nocturnal urination.  Endo: Negative for unusual weight change.  Physical Examination:   BP 118/59  Pulse 88  Temp(Src) 98.4 F (36.9 C) (Oral)  Ht 5\' 4"  (1.626 m)  Wt 163 lb 3.2 oz (74.027 kg)  BMI 28 kg/m2  General: Well-nourished, well-developed in no acute distress.  Eyes: No icterus. Mouth: Oropharyngeal mucosa moist and pink , no lesions erythema or exudate. Lungs: Clear to auscultation bilaterally.  Heart: Regular rate and rhythm, no murmurs rubs or gallops.  Abdomen: Bowel sounds are normal, nontender, nondistended, no hepatosplenomegaly or masses, no abdominal bruits or hernia , no rebound or guarding.   Extremities: No lower extremity edema. No clubbing or deformities. Neuro: Alert and oriented x 4   Skin: Warm and dry, no  jaundice.   Psych: Alert and cooperative, normal mood and affect.

## 2013-07-08 NOTE — Patient Instructions (Addendum)
1. Barium esophagram scheduled to further evaluate her swallowing difficulties. 2. Return to the office in 6 months to see Dr. Darrick Penna. 3. Continue omeprazole daily.

## 2013-07-10 ENCOUNTER — Encounter: Payer: Self-pay | Admitting: Gastroenterology

## 2013-07-10 NOTE — Assessment & Plan Note (Signed)
Ongoing dysphagia s/p EGD. Dilation was not performed as she denied dysphagia day of procedure. Plan for BPE. Continue PPI.

## 2013-07-10 NOTE — Assessment & Plan Note (Signed)
Continue PPI ?

## 2013-07-10 NOTE — Assessment & Plan Note (Addendum)
Clinically doing well. Continue enzymes.   OV with Dr. Darrick Penna in six months.

## 2013-07-12 NOTE — Progress Notes (Signed)
CC'd to PCP 

## 2013-07-14 ENCOUNTER — Ambulatory Visit (INDEPENDENT_AMBULATORY_CARE_PROVIDER_SITE_OTHER): Payer: Medicare Other | Admitting: Family Medicine

## 2013-07-14 ENCOUNTER — Telehealth: Payer: Self-pay | Admitting: Family Medicine

## 2013-07-14 VITALS — BP 110/78 | HR 78 | Temp 97.1°F | Resp 18 | Wt 166.0 lb

## 2013-07-14 DIAGNOSIS — E785 Hyperlipidemia, unspecified: Secondary | ICD-10-CM

## 2013-07-14 DIAGNOSIS — IMO0002 Reserved for concepts with insufficient information to code with codable children: Secondary | ICD-10-CM

## 2013-07-14 DIAGNOSIS — R413 Other amnesia: Secondary | ICD-10-CM

## 2013-07-14 DIAGNOSIS — T148XXA Other injury of unspecified body region, initial encounter: Secondary | ICD-10-CM

## 2013-07-14 DIAGNOSIS — I10 Essential (primary) hypertension: Secondary | ICD-10-CM

## 2013-07-14 DIAGNOSIS — E119 Type 2 diabetes mellitus without complications: Secondary | ICD-10-CM

## 2013-07-14 MED ORDER — OMEGA-3 FATTY ACIDS 1000 MG PO CAPS: 1.0000 g | ORAL_CAPSULE | Freq: Two times a day (BID) | ORAL | Status: AC

## 2013-07-14 NOTE — Patient Instructions (Addendum)
Apply the ointment to your nose once a day Take fish oil twice a day  Continue all other medications F/U 3 months

## 2013-07-15 ENCOUNTER — Encounter: Payer: Self-pay | Admitting: Family Medicine

## 2013-07-15 DIAGNOSIS — T148XXA Other injury of unspecified body region, initial encounter: Secondary | ICD-10-CM | POA: Insufficient documentation

## 2013-07-15 NOTE — Progress Notes (Signed)
  Subjective:    Patient ID: Kylie Tate, female    DOB: 1941-09-23, 72 y.o.   MRN: [id number]  HPI  Pt here to f/u hospital admission for hyperglycemia. Has seen endocine yesterday and regimen was adjusted. She is here with her aide today Satira Mccallum, who has been dispensing medications. No concerns today, she feels well, eating well. DM- CBG have been 300's in AM and 100's in evening, no hypoglycemia, has appt with podiatry coming up Hospital visit reviewed. Labs reviewed Medications reviewed Had a cold and rubbed her nose raw the other week  Review of Systems  GEN- denies fatigue, fever, weight loss,weakness, recent illness HEENT- denies eye drainage, change in vision, nasal discharge, CVS- denies chest pain, palpitations RESP- denies SOB, cough, wheeze ABD- denies N/V, change in stools, abd pain GU- denies dysuria, hematuria, dribbling, incontinence MSK- denies joint pain, muscle aches, injury Neuro- denies headache, dizziness, syncope, seizure activity      Objective:   Physical Exam GEN- NAD, alert and oriented x3 HEENT- PERRL, EOMI, non injected sclera, pink conjunctiva, MMM, oropharynx clear Neck- Supple, no thryomegaly CVS- RRR,1/6 Systolic murmur RESP-CTAB EXT- No edema Pulses- Radial 2+ Skin- right nares small abrasion, philtrum erythema and abrasion, no drainage, no vesicular or papular lesions       Assessment & Plan:

## 2013-07-15 NOTE — Assessment & Plan Note (Signed)
She now has HH aide and appears to be improving. Her children are also rotating weekends with her [facility] still involved

## 2013-07-15 NOTE — Assessment & Plan Note (Signed)
Well controlled no change to meds 

## 2013-07-15 NOTE — Assessment & Plan Note (Signed)
Given triple antibiotic ointment to use from office

## 2013-07-15 NOTE — Assessment & Plan Note (Signed)
TG were extremley high in 700's, she has history of chronic pancreatitis as well Now that she has Aide to give meds, this should improve Fish oil increased to BID dosing, continue statin therapy, recheck at next visit in 12 weeks

## 2013-07-15 NOTE — Assessment & Plan Note (Signed)
Seen by endocrine yesterday, Aide is giving SSI and long acting

## 2013-07-16 ENCOUNTER — Ambulatory Visit (HOSPITAL_COMMUNITY)
Admission: RE | Admit: 2013-07-16 | Discharge: 2013-07-16 | Disposition: A | Discharge status: Home / Self Care | Payer: Medicare Other | Source: Ambulatory Visit | Attending: Gastroenterology | Admitting: Gastroenterology

## 2013-07-16 ENCOUNTER — Encounter: Payer: Self-pay | Admitting: Internal Medicine

## 2013-07-16 DIAGNOSIS — R1310 Dysphagia, unspecified: Secondary | ICD-10-CM | POA: Insufficient documentation

## 2013-07-16 DIAGNOSIS — K449 Diaphragmatic hernia without obstruction or gangrene: Secondary | ICD-10-CM | POA: Insufficient documentation

## 2013-07-16 NOTE — Telephone Encounter (Signed)
She is on Metanx given by her foot doctor

## 2013-07-21 ENCOUNTER — Telehealth: Payer: Self-pay | Admitting: Family Medicine

## 2013-07-21 NOTE — Telephone Encounter (Signed)
She can have 1/2 a banana and plenty of water

## 2013-07-21 NOTE — Telephone Encounter (Signed)
Pt wants to go swimming and she needs to eat a banana before so that she does not get cramps. Care giver said that the banana will make her blood sugar go up and wants some advice on what to do.

## 2013-07-22 NOTE — Telephone Encounter (Signed)
Pt aware of message

## 2013-07-22 NOTE — Telephone Encounter (Signed)
LMTRC

## 2013-07-23 ENCOUNTER — Other Ambulatory Visit: Payer: Self-pay | Admitting: Family Medicine

## 2013-07-24 ENCOUNTER — Emergency Department (HOSPITAL_COMMUNITY)
Admission: EM | Admit: 2013-07-24 | Discharge: 2013-07-24 | Disposition: A | Discharge status: Home / Self Care | Payer: Medicare Other | Source: Home / Self Care | Attending: Emergency Medicine | Admitting: Emergency Medicine

## 2013-07-24 ENCOUNTER — Encounter (HOSPITAL_COMMUNITY): Payer: Self-pay | Admitting: Emergency Medicine

## 2013-07-24 DIAGNOSIS — N189 Chronic kidney disease, unspecified: Secondary | ICD-10-CM | POA: Insufficient documentation

## 2013-07-24 DIAGNOSIS — E785 Hyperlipidemia, unspecified: Secondary | ICD-10-CM | POA: Insufficient documentation

## 2013-07-24 DIAGNOSIS — R739 Hyperglycemia, unspecified: Secondary | ICD-10-CM

## 2013-07-24 DIAGNOSIS — R42 Dizziness and giddiness: Secondary | ICD-10-CM | POA: Insufficient documentation

## 2013-07-24 DIAGNOSIS — R358 Other polyuria: Secondary | ICD-10-CM | POA: Insufficient documentation

## 2013-07-24 DIAGNOSIS — Z7982 Long term (current) use of aspirin: Secondary | ICD-10-CM | POA: Insufficient documentation

## 2013-07-24 DIAGNOSIS — I129 Hypertensive chronic kidney disease with stage 1 through stage 4 chronic kidney disease, or unspecified chronic kidney disease: Secondary | ICD-10-CM | POA: Insufficient documentation

## 2013-07-24 DIAGNOSIS — Z794 Long term (current) use of insulin: Secondary | ICD-10-CM | POA: Insufficient documentation

## 2013-07-24 DIAGNOSIS — R631 Polydipsia: Secondary | ICD-10-CM | POA: Insufficient documentation

## 2013-07-24 DIAGNOSIS — R3589 Other polyuria: Secondary | ICD-10-CM | POA: Insufficient documentation

## 2013-07-24 DIAGNOSIS — F329 Major depressive disorder, single episode, unspecified: Secondary | ICD-10-CM | POA: Insufficient documentation

## 2013-07-24 DIAGNOSIS — E079 Disorder of thyroid, unspecified: Secondary | ICD-10-CM | POA: Insufficient documentation

## 2013-07-24 DIAGNOSIS — F3289 Other specified depressive episodes: Secondary | ICD-10-CM | POA: Insufficient documentation

## 2013-07-24 DIAGNOSIS — K219 Gastro-esophageal reflux disease without esophagitis: Secondary | ICD-10-CM | POA: Insufficient documentation

## 2013-07-24 DIAGNOSIS — Z862 Personal history of diseases of the blood and blood-forming organs and certain disorders involving the immune mechanism: Secondary | ICD-10-CM | POA: Insufficient documentation

## 2013-07-24 DIAGNOSIS — E119 Type 2 diabetes mellitus without complications: Secondary | ICD-10-CM | POA: Insufficient documentation

## 2013-07-24 DIAGNOSIS — R51 Headache: Secondary | ICD-10-CM | POA: Insufficient documentation

## 2013-07-24 DIAGNOSIS — Z79899 Other long term (current) drug therapy: Secondary | ICD-10-CM | POA: Insufficient documentation

## 2013-07-24 LAB — URINALYSIS, ROUTINE W REFLEX MICROSCOPIC
Bilirubin Urine: NEGATIVE
Glucose, UA: >1000 mg/dL — AB
Ketones, ur: NEGATIVE mg/dL
Leukocytes, UA: NEGATIVE
Nitrite: NEGATIVE
Protein, ur: NEGATIVE mg/dL
Specific Gravity, Urine: <1.005 — ABNORMAL LOW (ref 1.005–1.030)
Urobilinogen, UA: 0.2 mg/dL (ref 0.0–1.0)
pH: 5.5 (ref 5.0–8.0)

## 2013-07-24 LAB — CBC WITH DIFFERENTIAL/PLATELET
Basophils Absolute: 0 10*3/uL (ref 0.0–0.1)
Basophils Relative: 0 % (ref 0–1)
Eosinophils Absolute: 0 10*3/uL (ref 0.0–0.7)
Eosinophils Relative: 1 % (ref 0–5)
HCT: 32.9 % — ABNORMAL LOW (ref 36.0–46.0)
Hemoglobin: 10.6 g/dL — ABNORMAL LOW (ref 12.0–15.0)
Lymphocytes Relative: 26 % (ref 12–46)
Lymphs Abs: 2.2 10*3/uL (ref 0.7–4.0)
MCH: 29.7 pg (ref 26.0–34.0)
MCHC: 32.2 g/dL (ref 30.0–36.0)
MCV: 92.2 fL (ref 78.0–100.0)
Monocytes Absolute: 0.5 10*3/uL (ref 0.1–1.0)
Monocytes Relative: 5 % (ref 3–12)
Neutro Abs: 5.8 10*3/uL (ref 1.7–7.7)
Neutrophils Relative %: 68 % (ref 43–77)
Platelets: 271 10*3/uL (ref 150–400)
RBC: 3.57 MIL/uL — ABNORMAL LOW (ref 3.87–5.11)
RDW: 12.9 % (ref 11.5–15.5)
WBC: 8.5 10*3/uL (ref 4.0–10.5)

## 2013-07-24 LAB — URINE MICROSCOPIC-ADD ON

## 2013-07-24 LAB — BASIC METABOLIC PANEL
BUN: 43 mg/dL — ABNORMAL HIGH (ref 6–23)
CO2: 25 mEq/L (ref 19–32)
Calcium: 10 mg/dL (ref 8.4–10.5)
Chloride: 98 mEq/L (ref 96–112)
Creatinine, Ser: 1.63 mg/dL — ABNORMAL HIGH (ref 0.50–1.10)
GFR calc Af Amer: 36 mL/min — ABNORMAL LOW (ref >90)
GFR calc non Af Amer: 31 mL/min — ABNORMAL LOW (ref >90)
Glucose, Bld: 440 mg/dL — ABNORMAL HIGH (ref 70–99)
Potassium: 4.8 mEq/L (ref 3.5–5.1)
Sodium: 133 mEq/L — ABNORMAL LOW (ref 135–145)

## 2013-07-24 LAB — GLUCOSE, CAPILLARY: Glucose-Capillary: 195 mg/dL — ABNORMAL HIGH (ref 70–99)

## 2013-07-24 MED ORDER — SODIUM CHLORIDE 0.9 % IV BOLUS (SEPSIS): 1000.0000 mL | Freq: Once | INTRAVENOUS | Status: DC

## 2013-07-24 MED ORDER — SODIUM CHLORIDE 0.9 % IV BOLUS (SEPSIS)
1000.0000 mL | Freq: Once | INTRAVENOUS | Status: AC
  Administered 2013-07-24: 1000 mL via INTRAVENOUS

## 2013-07-24 MED ORDER — INSULIN ASPART 100 UNIT/ML ~~LOC~~ SOLN
20.0000 [IU] | Freq: Once | SUBCUTANEOUS | Status: AC
  Administered 2013-07-24: 20 [IU] via INTRAVENOUS
  Filled 2013-07-24: qty 1

## 2013-07-24 NOTE — ED Notes (Signed)
cbg checked in triage [facility]. Aware of same Kylie Tate

## 2013-07-24 NOTE — ED Notes (Signed)
Pt c/o hyperglycemia which she first noticed yesterday. Pt's blood glucose was 407 this morning and she received 21 units of Novolog. At lunch time pt's blood glucose was 515 and she received 27 units of Novolog. Pt reports headache, lightheadedness and states she feels thirsty.

## 2013-07-24 NOTE — ED Provider Notes (Signed)
CSN: [id number]     Arrival date & time 07/24/13  1416 History  This chart was scribed for Raeford Razor, MD by Quintella Reichert, ED scribe.  This patient was seen in room APA19/APA19 and the patient's care was started at 3:10 PM.    Chief Complaint  Patient presents with  . Hyperglycemia    The history is provided by the patient and a relative. No language interpreter was used.    HPI Comments: Kylie Tate is a 72 y.o. female with h/o DM, HTN and hyperlipidemia who presents to the Emergency Department complaining of gradual-onset, waxing-and-waning hyperglycemia that began yesterday.  Pt's daughter notes that pt drank orange juice 2 nights ago and her CBG rose throughout the day yesterday,  This morning her CBG was 407 at breakfast time and pt received 21 units of Novolog.  At lunch time her CBG was 515.  On admission it is 418.  Pt complains of polydipsia, polyuria, headache, lightheadedness and mild confusion as associated symptoms.  However family states that confusion is chronic.  Pt denies fever, chills, nausea, vomiting, dysuria, difficulty urinating, hematuria, or any other associated symptoms.  She has not eaten anything since breakfast today.   Past Medical History  Diagnosis Date  . Diabetes mellitus   . Hypertension   . GERD (gastroesophageal reflux disease)   . Hyperlipidemia   . Diverticulitis   . Thyroid disease     multinodular goiter  . Chronic pancreatitis   . Depression   . CRI (chronic renal insufficiency)   . Anemia, chronic disease   . Vertigo   . Diverticulosis     Past Surgical History  Procedure Laterality Date  . Nissen fundoplication    . Abdominal exploration surgery      abd pain-diverticulitis  . Colonoscopy  06/12/2000    Brodie-diffuse melanosis coli/mucousy exudate throughtout colon s/p bx (benign)  . Cholecystectomy  2001  . Colonoscopy with esophagogastroduodenoscopy (egd) N/A 03/10/2013    AOZ:HYQMVHQI sized internal  hemorrhoids/INCOMPLETE COLONOSCOPY. EGD showed mild erosive reflux esophagitis, no Barrett's. Erosive gastritis related to aspirin and Relafen, no H. pylori.  [name] dilation N/A 03/10/2013    Procedure: [name] DILATION;  Surgeon: West Bali, MD;  Location: [facility];  Service: Endoscopy;  Laterality: N/A;  . Savory dilation N/A 03/10/2013    Procedure: SAVORY DILATION;  Surgeon: West Bali, MD;  Location: [facility];  Service: Endoscopy;  Laterality: N/A;  . Colonoscopy N/A 04/26/2013    ONG:EXBM diverticulosis was noted in the sigmoid colon/Small internal hemorrhoids/The colon IS redundant    Family History  Problem Relation Age of Onset  . Heart disease    . Arthritis    . Cancer Brother     prostate  . Diabetes    . Heart disease Mother   . Hypertension Mother   . Hyperlipidemia Mother   . Diabetes Mother   . Depression Father   . Diabetes Father   . Diabetes Sister   . Colon cancer Neg Hx   . Pancreatitis Neg Hx     History  Substance Use Topics  . Smoking status: Never Smoker   . Smokeless tobacco: Not on file  . Alcohol Use: No    OB History   Grav Para Term Preterm Abortions TAB SAB Ect Mult Living                  Review of Systems  All other systems reviewed and are negative.  Allergies  Review of patient's allergies indicates no known allergies.  Home Medications   Current Outpatient Rx  Name  Route  Sig  Dispense  Refill  . aspirin EC 81 MG tablet   Oral   Take 81 mg by mouth daily. For Blood Thinner         . atorvastatin (LIPITOR) 10 MG tablet   Oral   Take 10 mg by mouth every morning. For Cholesterol         . atorvastatin (LIPITOR) 10 MG tablet      TAKE 1 TABLET (10 MG TOTAL) BY MOUTH DAILY. FOR CHOLESTEROL   30 tablet   2   . diazepam (VALIUM) 5 MG tablet   Oral   Take 5 mg by mouth 3 (three) times daily.         Marland Kitchen diltiazem (CARDIZEM CD) 180 MG 24 hr capsule   Oral   Take 180 mg by mouth every morning.  For Blood Pressure         . fish oil-omega-3 fatty acids 1000 MG capsule   Oral   Take 1 capsule (1 g total) by mouth 2 (two) times daily.         Marland Kitchen gabapentin (NEURONTIN) 100 MG capsule   Oral   Take 100 mg by mouth 2 (two) times daily. Neuropathy         . imipramine (TOFRANIL) 10 MG tablet      TAKE 3 TABLETS (30 MG TOTAL)  BY MOUTH AT BEDTIME FOR SLEEP/MOOD OR ASDIRECTED BY PHYSICIAN   90 tablet   2   . insulin aspart (NOVOLOG) 100 UNIT/ML injection   Subcutaneous   Inject 15 Units into the skin 3 (three) times daily before meals.         . insulin detemir (LEVEMIR) 100 UNIT/ML injection   Subcutaneous   Inject 60 Units into the skin at bedtime. First dose 10 AM tomorrow 7/27.         Marland Kitchen levothyroxine (SYNTHROID, LEVOTHROID) 50 MCG tablet   Oral   Take 50 mcg by mouth daily. For THYROID         . lipase/protease/amylase (CREON) 12000 UNITS CPEP   Oral   Take 2 capsules by mouth 3 (three) times daily. Takes 2 capsules with meals and 1 with snacks.         Marland Kitchen losartan (COZAAR) 50 MG tablet   Oral   Take 50 mg by mouth daily.         . Multiple Vitamins-Minerals (CENTRUM PO)   Oral   Take 1 tablet by mouth daily.          . nabumetone (RELAFEN) 500 MG tablet   Oral   Take 500 mg by mouth 2 (two) times daily.          Marland Kitchen omeprazole (PRILOSEC) 20 MG capsule   Oral   Take 20 mg by mouth daily. For Acid Reflux/GERD         . ranitidine (ZANTAC) 150 MG tablet   Oral   Take 150 mg by mouth daily.          BP 154/69  Pulse 74  Temp(Src) 98.6 F (37 C) (Oral)  Resp 20  Ht 5\' 4"  (1.626 m)  Wt 160 lb (72.576 kg)  BMI 27.45 kg/m2  SpO2 98%  Physical Exam  Nursing note and vitals reviewed. Constitutional: She appears well-developed and well-nourished. No distress.  HENT:  Head: Normocephalic and atraumatic.  Eyes:  Right eye exhibits no discharge. Left eye exhibits no discharge.  Conjunctivae pale  Neck: Neck supple.  Cardiovascular:  Normal rate, regular rhythm and normal heart sounds.  Exam reveals no gallop and no friction rub.   No murmur heard. Pulmonary/Chest: Effort normal and breath sounds normal. No respiratory distress.  Abdominal: Soft. She exhibits no distension. There is no tenderness.  Musculoskeletal: She exhibits no edema and no tenderness.  Neurological: She is alert.  Skin: Skin is warm and dry.  Psychiatric: She has a normal mood and affect. Her behavior is normal. Thought content normal.    ED Course  Procedures (including critical care time)  DIAGNOSTIC STUDIES: Oxygen Saturation is 98% on room air, normal by my interpretation.    COORDINATION OF CARE: 3:16 PM-Discussed treatment plan which includes labs with pt at bedside and pt agreed to plan.     Labs Review Labs Reviewed  GLUCOSE, CAPILLARY - Abnormal; Notable for the following:    Glucose-Capillary 418 (*)    All other components within normal limits  BASIC METABOLIC PANEL - Abnormal; Notable for the following:    Sodium 133 (*)    Glucose, Bld 440 (*)    BUN 43 (*)    Creatinine, Ser 1.63 (*)    GFR calc non Af Amer 31 (*)    GFR calc Af Amer 36 (*)    All other components within normal limits  URINALYSIS, ROUTINE W REFLEX MICROSCOPIC - Abnormal; Notable for the following:    Specific Gravity, Urine <1.005 (*)    Glucose, UA >1000 (*)    Hgb urine dipstick TRACE (*)    All other components within normal limits  CBC WITH DIFFERENTIAL - Abnormal; Notable for the following:    RBC 3.57 (*)    Hemoglobin 10.6 (*)    HCT 32.9 (*)    All other components within normal limits  URINE MICROSCOPIC-ADD ON - Abnormal; Notable for the following:    Squamous Epithelial / LPF FEW (*)    Bacteria, UA FEW (*)    All other components within normal limits  GLUCOSE, CAPILLARY - Abnormal; Notable for the following:    Glucose-Capillary 195 (*)    All other components within normal limits  URINE CULTURE   Imaging Review No results  found.  MDM   1. Hyperglycemia without ketosis    72 year old female with hyperglycemia without ketosis. IV fluids and insulin. Polyuria more likely related to her hyperglycemia. Urinalysis is not consistent with infection.    I personally preformed the services scribed in my presence. The recorded information has been reviewed is accurate. Raeford Razor, MD.    Raeford Razor, MD 07/24/13 2014

## 2013-07-26 LAB — URINE CULTURE: Colony Count: >=100000

## 2013-07-27 ENCOUNTER — Inpatient Hospital Stay (HOSPITAL_COMMUNITY)
Admission: EM | Admit: 2013-07-27 | Discharge: 2013-07-29 | DRG: 392 | Disposition: A | Discharge status: Home / Self Care | Payer: Medicare Other | Attending: Internal Medicine | Admitting: Internal Medicine

## 2013-07-27 ENCOUNTER — Telehealth: Payer: Self-pay | Admitting: Family Medicine

## 2013-07-27 ENCOUNTER — Encounter (HOSPITAL_COMMUNITY): Payer: Self-pay | Admitting: *Deleted

## 2013-07-27 ENCOUNTER — Emergency Department (HOSPITAL_COMMUNITY): Payer: Medicare Other

## 2013-07-27 DIAGNOSIS — I129 Hypertensive chronic kidney disease with stage 1 through stage 4 chronic kidney disease, or unspecified chronic kidney disease: Secondary | ICD-10-CM | POA: Diagnosis present

## 2013-07-27 DIAGNOSIS — Z794 Long term (current) use of insulin: Secondary | ICD-10-CM

## 2013-07-27 DIAGNOSIS — K529 Noninfective gastroenteritis and colitis, unspecified: Secondary | ICD-10-CM | POA: Diagnosis present

## 2013-07-27 DIAGNOSIS — N179 Acute kidney failure, unspecified: Secondary | ICD-10-CM | POA: Diagnosis present

## 2013-07-27 DIAGNOSIS — D638 Anemia in other chronic diseases classified elsewhere: Secondary | ICD-10-CM | POA: Diagnosis present

## 2013-07-27 DIAGNOSIS — N183 Chronic kidney disease, stage 3 unspecified: Secondary | ICD-10-CM | POA: Diagnosis present

## 2013-07-27 DIAGNOSIS — K2100 Gastro-esophageal reflux disease with esophagitis, without bleeding: Secondary | ICD-10-CM | POA: Diagnosis present

## 2013-07-27 DIAGNOSIS — R739 Hyperglycemia, unspecified: Secondary | ICD-10-CM | POA: Diagnosis present

## 2013-07-27 DIAGNOSIS — K625 Hemorrhage of anus and rectum: Secondary | ICD-10-CM

## 2013-07-27 DIAGNOSIS — Z79899 Other long term (current) drug therapy: Secondary | ICD-10-CM

## 2013-07-27 DIAGNOSIS — E042 Nontoxic multinodular goiter: Secondary | ICD-10-CM | POA: Diagnosis present

## 2013-07-27 DIAGNOSIS — E860 Dehydration: Secondary | ICD-10-CM | POA: Diagnosis present

## 2013-07-27 DIAGNOSIS — E119 Type 2 diabetes mellitus without complications: Secondary | ICD-10-CM | POA: Diagnosis present

## 2013-07-27 DIAGNOSIS — E785 Hyperlipidemia, unspecified: Secondary | ICD-10-CM | POA: Diagnosis present

## 2013-07-27 DIAGNOSIS — N189 Chronic kidney disease, unspecified: Secondary | ICD-10-CM

## 2013-07-27 DIAGNOSIS — IMO0001 Reserved for inherently not codable concepts without codable children: Secondary | ICD-10-CM | POA: Diagnosis present

## 2013-07-27 DIAGNOSIS — K922 Gastrointestinal hemorrhage, unspecified: Secondary | ICD-10-CM

## 2013-07-27 DIAGNOSIS — I10 Essential (primary) hypertension: Secondary | ICD-10-CM | POA: Diagnosis present

## 2013-07-27 DIAGNOSIS — Z800 Family history of malignant neoplasm of digestive organs: Secondary | ICD-10-CM

## 2013-07-27 DIAGNOSIS — A09 Infectious gastroenteritis and colitis, unspecified: Principal | ICD-10-CM | POA: Diagnosis present

## 2013-07-27 DIAGNOSIS — K5289 Other specified noninfective gastroenteritis and colitis: Secondary | ICD-10-CM

## 2013-07-27 HISTORY — DX: Noninfective gastroenteritis and colitis, unspecified: K52.9

## 2013-07-27 LAB — CBC
HCT: 32.5 % — ABNORMAL LOW (ref 36.0–46.0)
MCH: 30.1 pg (ref 26.0–34.0)
MCHC: 32.3 g/dL (ref 30.0–36.0)
MCHC: 33.1 g/dL (ref 30.0–36.0)
MCV: 92.3 fL (ref 78.0–100.0)
Platelets: 252 10*3/uL (ref 150–400)
RBC: 3.29 MIL/uL — ABNORMAL LOW (ref 3.87–5.11)
RDW: 13.3 % (ref 11.5–15.5)
RDW: 13.4 % (ref 11.5–15.5)

## 2013-07-27 LAB — BASIC METABOLIC PANEL
BUN: 57 mg/dL — ABNORMAL HIGH (ref 6–23)
Creatinine, Ser: 1.95 mg/dL — ABNORMAL HIGH (ref 0.50–1.10)
GFR calc Af Amer: 29 mL/min — ABNORMAL LOW (ref >90)
GFR calc non Af Amer: 25 mL/min — ABNORMAL LOW (ref >90)
Glucose, Bld: 453 mg/dL — ABNORMAL HIGH (ref 70–99)
Potassium: 4.9 mEq/L (ref 3.5–5.1)

## 2013-07-27 LAB — CBC WITH DIFFERENTIAL/PLATELET
Basophils Relative: 0 % (ref 0–1)
Eosinophils Absolute: 0 10*3/uL (ref 0.0–0.7)
HCT: 34.6 % — ABNORMAL LOW (ref 36.0–46.0)
Hemoglobin: 11.4 g/dL — ABNORMAL LOW (ref 12.0–15.0)
Lymphs Abs: 1 10*3/uL (ref 0.7–4.0)
MCH: 30.3 pg (ref 26.0–34.0)
MCHC: 32.9 g/dL (ref 30.0–36.0)
MCV: 92 fL (ref 78.0–100.0)
Monocytes Absolute: 1.1 10*3/uL — ABNORMAL HIGH (ref 0.1–1.0)
Monocytes Relative: 5 % (ref 3–12)
Neutrophils Relative %: 91 % — ABNORMAL HIGH (ref 43–77)
RBC: 3.76 MIL/uL — ABNORMAL LOW (ref 3.87–5.11)

## 2013-07-27 LAB — OCCULT BLOOD, POC DEVICE: Fecal Occult Bld: POSITIVE — AB

## 2013-07-27 LAB — HEPATIC FUNCTION PANEL
ALT: 19 U/L (ref 0–35)
AST: 22 U/L (ref 0–37)
Bilirubin, Direct: 0.1 mg/dL (ref 0.0–0.3)
Indirect Bilirubin: 0.4 mg/dL (ref 0.3–0.9)
Total Bilirubin: 0.5 mg/dL (ref 0.3–1.2)

## 2013-07-27 MED ORDER — CIPROFLOXACIN IN D5W 400 MG/200ML IV SOLN
INTRAVENOUS | Status: AC
  Filled 2013-07-27: qty 200

## 2013-07-27 MED ORDER — INSULIN DETEMIR 100 UNIT/ML ~~LOC~~ SOLN
SUBCUTANEOUS | Status: AC
  Filled 2013-07-27: qty 1

## 2013-07-27 MED ORDER — METRONIDAZOLE IN NACL 5-0.79 MG/ML-% IV SOLN
INTRAVENOUS | Status: AC
  Filled 2013-07-27: qty 200

## 2013-07-27 MED ORDER — ACETAMINOPHEN 650 MG RE SUPP: 650.0000 mg | Freq: Four times a day (QID) | RECTAL | Status: DC | PRN

## 2013-07-27 MED ORDER — SODIUM CHLORIDE 0.9 % IV BOLUS (SEPSIS)
1000.0000 mL | Freq: Once | INTRAVENOUS | Status: AC
  Administered 2013-07-27: 1000 mL via INTRAVENOUS

## 2013-07-27 MED ORDER — GABAPENTIN 100 MG PO CAPS
100.0000 mg | ORAL_CAPSULE | Freq: Two times a day (BID) | ORAL | Status: DC
  Administered 2013-07-27 – 2013-07-29 (×4): 100 mg via ORAL
  Filled 2013-07-27 (×4): qty 1

## 2013-07-27 MED ORDER — ONDANSETRON HCL 4 MG PO TABS: 4.0000 mg | ORAL_TABLET | Freq: Four times a day (QID) | ORAL | Status: DC | PRN

## 2013-07-27 MED ORDER — ATORVASTATIN CALCIUM 10 MG PO TABS
10.0000 mg | ORAL_TABLET | Freq: Every day | ORAL | Status: DC
  Administered 2013-07-27 – 2013-07-28 (×2): 10 mg via ORAL
  Filled 2013-07-27 (×2): qty 1

## 2013-07-27 MED ORDER — SODIUM CHLORIDE 0.9 % IV SOLN
INTRAVENOUS | Status: DC
  Administered 2013-07-27 – 2013-07-28 (×3): via INTRAVENOUS

## 2013-07-27 MED ORDER — FAMOTIDINE 20 MG PO TABS
20.0000 mg | ORAL_TABLET | Freq: Two times a day (BID) | ORAL | Status: DC
  Administered 2013-07-27 – 2013-07-29 (×4): 20 mg via ORAL
  Filled 2013-07-27 (×4): qty 1

## 2013-07-27 MED ORDER — MORPHINE SULFATE 2 MG/ML IJ SOLN: 2.0000 mg | INTRAMUSCULAR | Status: DC | PRN

## 2013-07-27 MED ORDER — IMIPRAMINE HCL 10 MG PO TABS
ORAL_TABLET | ORAL | Status: AC
  Filled 2013-07-27: qty 3

## 2013-07-27 MED ORDER — PANCRELIPASE (LIP-PROT-AMYL) 12000-38000 UNITS PO CPEP: 1.0000 | ORAL_CAPSULE | ORAL | Status: DC | PRN

## 2013-07-27 MED ORDER — DILTIAZEM HCL ER COATED BEADS 180 MG PO CP24
180.0000 mg | ORAL_CAPSULE | Freq: Every morning | ORAL | Status: DC
  Administered 2013-07-28 – 2013-07-29 (×2): 180 mg via ORAL
  Filled 2013-07-27 (×2): qty 1

## 2013-07-27 MED ORDER — LEVOTHYROXINE SODIUM 50 MCG PO TABS
50.0000 ug | ORAL_TABLET | Freq: Every day | ORAL | Status: DC
  Administered 2013-07-28 – 2013-07-29 (×2): 50 ug via ORAL
  Filled 2013-07-27 (×3): qty 1

## 2013-07-27 MED ORDER — INSULIN ASPART 100 UNIT/ML ~~LOC~~ SOLN
0.0000 [IU] | Freq: Every day | SUBCUTANEOUS | Status: DC
  Administered 2013-07-27: 3 [IU] via SUBCUTANEOUS

## 2013-07-27 MED ORDER — INSULIN DETEMIR 100 UNIT/ML ~~LOC~~ SOLN
60.0000 [IU] | Freq: Every day | SUBCUTANEOUS | Status: DC
  Administered 2013-07-27 – 2013-07-28 (×2): 60 [IU] via SUBCUTANEOUS
  Filled 2013-07-27 (×3): qty 0.6

## 2013-07-27 MED ORDER — ONDANSETRON HCL 4 MG/2ML IJ SOLN: 4.0000 mg | Freq: Four times a day (QID) | INTRAMUSCULAR | Status: DC | PRN

## 2013-07-27 MED ORDER — CYCLOSPORINE 0.05 % OP EMUL
1.0000 [drp] | Freq: Two times a day (BID) | OPHTHALMIC | Status: DC
  Administered 2013-07-28 – 2013-07-29 (×3): 1 [drp] via OPHTHALMIC
  Filled 2013-07-27 (×6): qty 1

## 2013-07-27 MED ORDER — INSULIN ASPART 100 UNIT/ML ~~LOC~~ SOLN
10.0000 [IU] | Freq: Once | SUBCUTANEOUS | Status: AC
  Administered 2013-07-27: 10 [IU] via SUBCUTANEOUS
  Filled 2013-07-27: qty 1

## 2013-07-27 MED ORDER — ACETAMINOPHEN 325 MG PO TABS
650.0000 mg | ORAL_TABLET | Freq: Four times a day (QID) | ORAL | Status: DC | PRN
  Administered 2013-07-27: 650 mg via ORAL
  Filled 2013-07-27: qty 2

## 2013-07-27 MED ORDER — METRONIDAZOLE IN NACL 5-0.79 MG/ML-% IV SOLN
500.0000 mg | Freq: Four times a day (QID) | INTRAVENOUS | Status: DC
  Administered 2013-07-27 – 2013-07-28 (×4): 500 mg via INTRAVENOUS
  Filled 2013-07-27 (×8): qty 100

## 2013-07-27 MED ORDER — PANTOPRAZOLE SODIUM 40 MG PO TBEC
40.0000 mg | DELAYED_RELEASE_TABLET | Freq: Every day | ORAL | Status: DC
  Administered 2013-07-27 – 2013-07-29 (×3): 40 mg via ORAL
  Filled 2013-07-27 (×3): qty 1

## 2013-07-27 MED ORDER — DIAZEPAM 5 MG PO TABS
5.0000 mg | ORAL_TABLET | Freq: Three times a day (TID) | ORAL | Status: DC
  Administered 2013-07-27 – 2013-07-29 (×6): 5 mg via ORAL
  Filled 2013-07-27 (×6): qty 1

## 2013-07-27 MED ORDER — IMIPRAMINE HCL 10 MG PO TABS
30.0000 mg | ORAL_TABLET | Freq: Every day | ORAL | Status: DC
  Administered 2013-07-27 – 2013-07-28 (×2): 30 mg via ORAL
  Filled 2013-07-27 (×3): qty 3

## 2013-07-27 MED ORDER — PANCRELIPASE (LIP-PROT-AMYL) 12000-38000 UNITS PO CPEP
2.0000 | ORAL_CAPSULE | Freq: Three times a day (TID) | ORAL | Status: DC
  Administered 2013-07-27 – 2013-07-29 (×6): 2 via ORAL
  Filled 2013-07-27 (×6): qty 2

## 2013-07-27 MED ORDER — IOHEXOL 300 MG/ML  SOLN
50.0000 mL | INTRAMUSCULAR | Status: DC
  Administered 2013-07-27: 50 mL via ORAL

## 2013-07-27 MED ORDER — CIPROFLOXACIN IN D5W 400 MG/200ML IV SOLN
400.0000 mg | INTRAVENOUS | Status: DC
  Administered 2013-07-27 – 2013-07-28 (×2): 400 mg via INTRAVENOUS
  Filled 2013-07-27 (×2): qty 200

## 2013-07-27 MED ORDER — INSULIN ASPART 100 UNIT/ML ~~LOC~~ SOLN
0.0000 [IU] | Freq: Three times a day (TID) | SUBCUTANEOUS | Status: DC
  Administered 2013-07-27: 15 [IU] via SUBCUTANEOUS
  Administered 2013-07-28 (×2): 2 [IU] via SUBCUTANEOUS
  Administered 2013-07-28: 3 [IU] via SUBCUTANEOUS

## 2013-07-27 NOTE — ED Provider Notes (Signed)
CSN: [id number]     Arrival date & time 07/27/13  0909 History   First MD Initiated Contact with Patient 07/27/13 [phone]     Chief Complaint  Patient presents with  . Rectal Bleeding  . Hyperglycemia   (Consider location/radiation/quality/duration/timing/severity/associated sxs/prior Treatment) HPI Comments: Kylie Tate is a 72 y.o. Female presenting with bright red rectal bleeding along with soft to watery bowel movements which woke her at 2 AM today.  She reports 3-4 episodes of red blood since her symptoms began.  In addition she has left lower quadrant cramping pain which is intermittent and worsens prior to bowel movement.  She denies fevers or chills, has had no nausea or vomiting.  Additionally she has uncontrolled diabetes for which she was seen here in the ED just several days ago.  She had an appointment for today scheduled with Dr. Fransico Him which she canceled in order to come here.  Her caregiver at the bedside states her last blood glucose level at home was 380.  Patient reports be having increased thirst and has been drinking plenty of fluids, denies increased urinary frequency.  She does have a known history of diverticulosis and also has mild internal hemorrhoids her last colonoscopy 3 months ago.  She denies rectal pain.  She does feel increasingly weak over the past several days.     The history is provided by the patient and a caregiver.    Past Medical History  Diagnosis Date  . Diabetes mellitus   . Hypertension   . GERD (gastroesophageal reflux disease)   . Hyperlipidemia   . Diverticulitis   . Thyroid disease     multinodular goiter  . Chronic pancreatitis   . Depression   . CRI (chronic renal insufficiency)   . Anemia, chronic disease   . Vertigo   . Diverticulosis    Past Surgical History  Procedure Laterality Date  . Nissen fundoplication    . Abdominal exploration surgery      abd pain-diverticulitis  . Colonoscopy  06/12/2000    Brodie-diffuse melanosis  coli/mucousy exudate throughtout colon s/p bx (benign)  . Cholecystectomy  2001  . Colonoscopy with esophagogastroduodenoscopy (egd) N/A 03/10/2013    VZD:GLOVFIEP sized internal hemorrhoids/INCOMPLETE COLONOSCOPY. EGD showed mild erosive reflux esophagitis, no Barrett's. Erosive gastritis related to aspirin and Relafen, no H. pylori.  [name] dilation N/A 03/10/2013    Procedure: [name] DILATION;  Surgeon: West Bali, MD;  Location: [facility];  Service: Endoscopy;  Laterality: N/A;  . Savory dilation N/A 03/10/2013    Procedure: SAVORY DILATION;  Surgeon: West Bali, MD;  Location: [facility];  Service: Endoscopy;  Laterality: N/A;  . Colonoscopy N/A 04/26/2013    PIR:JJOA diverticulosis was noted in the sigmoid colon/Small internal hemorrhoids/The colon IS redundant   Family History  Problem Relation Age of Onset  . Heart disease    . Arthritis    . Cancer Brother     prostate  . Diabetes    . Heart disease Mother   . Hypertension Mother   . Hyperlipidemia Mother   . Diabetes Mother   . Depression Father   . Diabetes Father   . Diabetes Sister   . Colon cancer Neg Hx   . Pancreatitis Neg Hx    History  Substance Use Topics  . Smoking status: Never Smoker   . Smokeless tobacco: Not on file  . Alcohol Use: No   OB History   Grav Para Term Preterm Abortions  TAB SAB Ect Mult Living                 Review of Systems  Constitutional: Positive for fatigue. Negative for fever and chills.  HENT: Negative for congestion, sore throat and neck pain.   Eyes: Negative.   Respiratory: Negative for chest tightness and shortness of breath.   Cardiovascular: Negative for chest pain.  Gastrointestinal: Positive for abdominal pain, diarrhea and blood in stool. Negative for nausea and vomiting.  Endocrine: Positive for polydipsia. Negative for polyuria.  Genitourinary: Negative.   Musculoskeletal: Negative for joint swelling and arthralgias.  Skin: Negative.  Negative for  rash and wound.  Neurological: Positive for weakness. Negative for dizziness, light-headedness, numbness and headaches.  Psychiatric/Behavioral: Negative.     Allergies  Review of patient's allergies indicates no known allergies.  Home Medications   Current Outpatient Rx  Name  Route  Sig  Dispense  Refill  . aspirin EC 81 MG tablet   Oral   Take 81 mg by mouth daily. For Blood Thinner         . atorvastatin (LIPITOR) 10 MG tablet   Oral   Take 10 mg by mouth every morning. For Cholesterol         . cycloSPORINE (RESTASIS) 0.05 % ophthalmic emulsion   Both Eyes   Place 1 drop into both eyes 2 (two) times daily.         . diazepam (VALIUM) 5 MG tablet   Oral   Take 5 mg by mouth 3 (three) times daily.         Marland Kitchen diltiazem (CARDIZEM CD) 180 MG 24 hr capsule   Oral   Take 180 mg by mouth every morning. For Blood Pressure         . fish oil-omega-3 fatty acids 1000 MG capsule   Oral   Take 1 capsule (1 g total) by mouth 2 (two) times daily.         Marland Kitchen gabapentin (NEURONTIN) 100 MG capsule   Oral   Take 100 mg by mouth 2 (two) times daily. Neuropathy         . imipramine (TOFRANIL) 10 MG tablet   Oral   Take 30 mg by mouth at bedtime. For sleep and for mood         . insulin aspart (NOVOLOG) 100 UNIT/ML injection   Subcutaneous   Inject 16-20 Units into the skin See admin instructions. Injects 15 units in addition to sliding scale as directed:  151-200= 16 units 201-250= 17 units 251-300= 18 units 301-350= 19 units 351-400= 20 units 401/more= 21 units         . insulin detemir (LEVEMIR) 100 UNIT/ML injection   Subcutaneous   Inject 60 Units into the skin at bedtime.          Marland Kitchen levothyroxine (SYNTHROID, LEVOTHROID) 50 MCG tablet   Oral   Take 50 mcg by mouth daily.          . lipase/protease/amylase (CREON) 12000 UNITS CPEP   Oral   Take 2 capsules by mouth 3 (three) times daily. Takes 2 capsules with meals and 1 with snacks.          Marland Kitchen losartan (COZAAR) 50 MG tablet   Oral   Take 50 mg by mouth daily.         . Multiple Vitamins-Minerals (CENTRUM PO)   Oral   Take 1 tablet by mouth daily.          Marland Kitchen  nabumetone (RELAFEN) 500 MG tablet   Oral   Take 500 mg by mouth 2 (two) times daily.          Marland Kitchen omeprazole (PRILOSEC) 20 MG capsule   Oral   Take 20 mg by mouth daily. For Acid Reflux/GERD         . ranitidine (ZANTAC) 150 MG tablet   Oral   Take 150 mg by mouth daily.          BP 121/57  Pulse 97  Temp(Src) 98.7 F (37.1 C) (Oral)  Resp 17  SpO2 99% Physical Exam  Nursing note and vitals reviewed. Constitutional: She appears well-developed and well-nourished.  HENT:  Head: Normocephalic and atraumatic.  Eyes:  Pale conjunctiva.  Neck: Normal range of motion.  Cardiovascular: Normal rate, regular rhythm, normal heart sounds and intact distal pulses.   Pulmonary/Chest: Effort normal and breath sounds normal. She has no wheezes.  Abdominal: Soft. Bowel sounds are normal. She exhibits no distension. There is tenderness in the left lower quadrant. There is no rigidity, no rebound and no guarding.  Genitourinary: Rectal exam shows no mass. Guaiac positive stool.  Musculoskeletal: Normal range of motion.  Neurological: She is alert.  Skin: Skin is warm and dry.  Psychiatric: She has a normal mood and affect.    ED Course  Procedures (including critical care time) Labs Review Labs Reviewed  CBC WITH DIFFERENTIAL - Abnormal; Notable for the following:    WBC 21.7 (*)    RBC 3.76 (*)    Hemoglobin 11.4 (*)    HCT 34.6 (*)    Neutrophils Relative % 91 (*)    Neutro Abs 19.6 (*)    Lymphocytes Relative 4 (*)    Monocytes Absolute 1.1 (*)    All other components within normal limits  BASIC METABOLIC PANEL - Abnormal; Notable for the following:    Glucose, Bld 453 (*)    BUN 57 (*)    Creatinine, Ser 1.95 (*)    GFR calc non Af Amer 25 (*)    GFR calc Af Amer 29 (*)    All other  components within normal limits  HEPATIC FUNCTION PANEL - Abnormal; Notable for the following:    Alkaline Phosphatase 188 (*)    All other components within normal limits  GLUCOSE, CAPILLARY - Abnormal; Notable for the following:    Glucose-Capillary 263 (*)    All other components within normal limits  URINALYSIS, ROUTINE W REFLEX MICROSCOPIC   Imaging Review Ct Abdomen Pelvis Wo Contrast  07/27/2013   *RADIOLOGY REPORT*  Clinical Data: GI bleed  CT ABDOMEN AND PELVIS WITHOUT CONTRAST  Technique:  Multidetector CT imaging of the abdomen and pelvis was performed following the standard protocol without intravenous contrast.  Comparison: CTA abdomen pelvis dated 09/22/2010  Findings: Mild subpleural reticulation/atelectasis in the left lower lobe.  Coronary atherosclerosis.  Small hiatal hernia.  Surgical clips at the GE junction.  Unenhanced liver, spleen, and left adrenal gland are within normal limits.  Mild nodular thickening of the right adrenal gland (series 2/image 20), unchanged.  Pancreatic atrophy with associated parenchymal calcifications, suggesting sequela of prior/chronic pancreatitis.  Status post cholecystectomy.  No intrahepatic or extrahepatic ductal dilatation.  Kidneys are notable for cortical thinning bilaterally.  No renal calculi or hydronephrosis.  Vascular calcifications.  No evidence of bowel obstruction.  Normal appendix.  Mild colonic wall thickening involving the ascending colon/hepatic flexure (series 2/images 27 and 51), possibly reflecting infectious/inflammatory colitis, although without convincing surrounding  inflammatory stranding.  Atherosclerotic calcifications of the abdominal aorta and branch vessels.  No abdominopelvic ascites.  No suspicious abdominopelvic lymphadenopathy.  Mild nonspecific lymphatic stranding in the jejunal mesentery (series 2/image 41).  Extensive atherosclerotic calcifications of the abdominal aorta and branch vessels.  Uterus is unremarkable.  No  adnexal masses.  Bladder is within normal limits.  Numerous calcified pelvic phleboliths.  Degenerative changes of the visualized thoracolumbar spine.  IMPRESSION: Suspected mild wall thickening involving the right colon, correlate for infectious/inflammatory colitis.  No evidence of bowel obstruction.  Normal appendix.  Additional ancillary findings as above.   Original Report Authenticated By: Charline Bills, M.D.    MDM   1. GI bleed   2. Hyperglycemia    Pt was seen by Dr Estell Harpin also during this visit.   She had several small loose stools while in ed,  I witnessed one which had pink tinged border (in Depends) but not grossly bloody.    Patients labs and/or radiological studies were viewed and considered during the medical decision making and disposition process. Discussed case with Dr. Irene Limbo who will admit patient for further management, temp admit orders completed.    Burgess Amor, PA-C 07/27/13 1645

## 2013-07-27 NOTE — Telephone Encounter (Signed)
Noted pt with rectal bleed, hyperglycemia In ED this weekend for hyperlgycemia

## 2013-07-27 NOTE — H&P (Signed)
History and Physical  Kylie Tate:[id number] DOB: [birth year] DOA: 07/27/2013  Referring physician: Dr. Estell Harpin PCP: [name], MD   Chief Complaint: Bleeding  HPI:  72 year old woman with history of diverticulosis of the sigmoid colon, small internal hemorrhoids who presents the emergency department with sudden onset of diarrhea, abdominal pain and rectal bleeding this morning. Initial evaluation was notable for right-sided colitis CT abdomen and pelvis indicated on chronic renal failure.  History obtained from patient and chart. She has been overall doing well of late except blood sugars have been running high. She had no abdominal pain or bowel symptoms yesterday. Approximately 2 AM she awoke and went to the bathroom. She had multiple episodes of diarrhea, the last 2 with some blood seen. She also had lower abdominal pain which she describes as an ache, roughly moderate intensity. There has been no vomiting. No other complaints.  In emergency department afebrile, vital signs stable. BUN and creatinine were elevated above baseline 57/1.95. Glucose elevated 453. Normal bicarbonate. White blood cell count 21.7. EKG was nonacute.  Review of Systems:  Negative for fever, visual changes, sore throat, rash, new muscle aches, chest pain, SOB, dysuria.  Past Medical History  Diagnosis Date  . Diabetes mellitus   . Hypertension   . GERD (gastroesophageal reflux disease)   . Hyperlipidemia   . Diverticulitis   . Thyroid disease     multinodular goiter  . Chronic pancreatitis   . Depression   . CRI (chronic renal insufficiency)   . Anemia, chronic disease   . Vertigo   . Diverticulosis     Past Surgical History  Procedure Laterality Date  . Nissen fundoplication    . Abdominal exploration surgery      abd pain-diverticulitis  . Colonoscopy  06/12/2000    Brodie-diffuse melanosis coli/mucousy exudate throughtout colon s/p bx (benign)  . Cholecystectomy  2001  . Colonoscopy  with esophagogastroduodenoscopy (egd) N/A 03/10/2013    [id number] sized internal hemorrhoids/INCOMPLETE COLONOSCOPY. EGD showed mild erosive reflux esophagitis, no Barrett's. Erosive gastritis related to aspirin and Relafen, no H. pylori.  [name] dilation N/A 03/10/2013    Procedure: [name] DILATION;  Surgeon: West Bali, MD;  Location: [facility];  Service: Endoscopy;  Laterality: N/A;  . Savory dilation N/A 03/10/2013    Procedure: SAVORY DILATION;  Surgeon: West Bali, MD;  Location: [facility];  Service: Endoscopy;  Laterality: N/A;  . Colonoscopy N/A 04/26/2013    AOZ:HYQM diverticulosis was noted in the sigmoid colon/Small internal hemorrhoids/The colon IS redundant    Social History:  reports that she has never smoked. She does not have any smokeless tobacco history on file. She reports that she does not drink alcohol or use illicit drugs.  No Known Allergies  Family History  Problem Relation Age of Onset  . Heart disease    . Arthritis    . Cancer Brother     prostate  . Diabetes    . Heart disease Mother   . Hypertension Mother   . Hyperlipidemia Mother   . Diabetes Mother   . Depression Father   . Diabetes Father   . Diabetes Sister   . Colon cancer Neg Hx   . Pancreatitis Neg Hx      Prior to Admission medications   Medication Sig Start Date End Date Taking? Authorizing Provider  aspirin EC 81 MG tablet Take 81 mg by mouth daily. For Blood Thinner   Yes Historical Provider, MD  atorvastatin (LIPITOR)  10 MG tablet Take 10 mg by mouth every morning. For Cholesterol   Yes Historical Provider, MD  cycloSPORINE (RESTASIS) 0.05 % ophthalmic emulsion Place 1 drop into both eyes 2 (two) times daily.   Yes Historical Provider, MD  diazepam (VALIUM) 5 MG tablet Take 5 mg by mouth 3 (three) times daily.   Yes Historical Provider, MD  diltiazem (CARDIZEM CD) 180 MG 24 hr capsule Take 180 mg by mouth every morning. For Blood Pressure   Yes Historical Provider, MD   fish oil-omega-3 fatty acids 1000 MG capsule Take 1 capsule (1 g total) by mouth 2 (two) times daily. 07/14/13  Yes Salley Scarlet, MD  gabapentin (NEURONTIN) 100 MG capsule Take 100 mg by mouth 2 (two) times daily. Neuropathy   Yes Historical Provider, MD  imipramine (TOFRANIL) 10 MG tablet Take 30 mg by mouth at bedtime. For sleep and for mood   Yes Historical Provider, MD  insulin aspart (NOVOLOG) 100 UNIT/ML injection Inject 16-20 Units into the skin See admin instructions. Injects 15 units in addition to sliding scale as directed:  151-200= 16 units 201-250= 17 units 251-300= 18 units 301-350= 19 units 351-400= 20 units 401/more= 21 units 06/12/13  Yes Standley Brooking, MD  insulin detemir (LEVEMIR) 100 UNIT/ML injection Inject 60 Units into the skin at bedtime.  06/12/13  Yes Standley Brooking, MD  levothyroxine (SYNTHROID, LEVOTHROID) 50 MCG tablet Take 50 mcg by mouth daily.    Yes Historical Provider, MD  lipase/protease/amylase (CREON) 12000 UNITS CPEP Take 2 capsules by mouth 3 (three) times daily. Takes 2 capsules with meals and 1 with snacks.   Yes Historical Provider, MD  losartan (COZAAR) 50 MG tablet Take 50 mg by mouth daily.   Yes Historical Provider, MD  Multiple Vitamins-Minerals (CENTRUM PO) Take 1 tablet by mouth daily.    Yes Historical Provider, MD  nabumetone (RELAFEN) 500 MG tablet Take 500 mg by mouth 2 (two) times daily.  06/19/13  Yes Historical Provider, MD  omeprazole (PRILOSEC) 20 MG capsule Take 20 mg by mouth daily. For Acid Reflux/GERD   Yes Historical Provider, MD  ranitidine (ZANTAC) 150 MG tablet Take 150 mg by mouth daily.   Yes Historical Provider, MD   Physical Exam: Filed Vitals:   07/27/13 0941 07/27/13 1400  BP: 140/53 121/57  Pulse: 97   Temp: 98.7 F (37.1 C)   TempSrc: Oral   Resp: 20 17  SpO2: 99%    General: Examined in the emergency department. Appears calm and comfortable Eyes: PERRL, normal lids, irises  ENT: grossly normal hearing,  lips & tongue Neck: no LAD, masses. Goiter noted. Cardiovascular: RRR, no m/r/g. No LE edema. Respiratory: CTA bilaterally, no w/r/r. Normal respiratory effort. Abdomen: soft, nondistended. Mild lower abdominal pain right, central, left. Benign exam. Skin: no rash or induration seen  Musculoskeletal: grossly normal tone BUE/BLE Psychiatric: grossly normal mood and affect, speech fluent and appropriate Neurologic: grossly non-focal.  Wt Readings from Last 3 Encounters:  07/24/13 72.576 kg (160 lb)  07/14/13 75.297 kg (166 lb)  07/08/13 74.027 kg (163 lb 3.2 oz)    Labs on Admission:  Basic Metabolic Panel:  Recent Labs Lab 07/24/13 1536 07/27/13 1011  NA 133* 136  K 4.8 4.9  CL 98 100  CO2 25 21  GLUCOSE 440* 453*  BUN 43* 57*  CREATININE 1.63* 1.95*  CALCIUM 10.0 10.2    Liver Function Tests:  Recent Labs Lab 07/27/13 1011  AST 22  ALT  19  ALKPHOS 188*  BILITOT 0.5  PROT 7.4  ALBUMIN 3.5    CBC:  Recent Labs Lab 07/24/13 1536 07/27/13 1011  WBC 8.5 21.7*  NEUTROABS 5.8 19.6*  HGB 10.6* 11.4*  HCT 32.9* 34.6*  MCV 92.2 92.0  PLT 271 299    CBG:  Recent Labs Lab 07/24/13 1433 07/24/13 1732 07/27/13 1551  GLUCAP 418* 195* 263*     Radiological Exams on Admission: Ct Abdomen Pelvis Wo Contrast  07/27/2013   *RADIOLOGY REPORT*  Clinical Data: GI bleed  CT ABDOMEN AND PELVIS WITHOUT CONTRAST  Technique:  Multidetector CT imaging of the abdomen and pelvis was performed following the standard protocol without intravenous contrast.  Comparison: CTA abdomen pelvis dated 09/22/2010  Findings: Mild subpleural reticulation/atelectasis in the left lower lobe.  Coronary atherosclerosis.  Small hiatal hernia.  Surgical clips at the GE junction.  Unenhanced liver, spleen, and left adrenal gland are within normal limits.  Mild nodular thickening of the right adrenal gland (series 2/image 20), unchanged.  Pancreatic atrophy with associated parenchymal  calcifications, suggesting sequela of prior/chronic pancreatitis.  Status post cholecystectomy.  No intrahepatic or extrahepatic ductal dilatation.  Kidneys are notable for cortical thinning bilaterally.  No renal calculi or hydronephrosis.  Vascular calcifications.  No evidence of bowel obstruction.  Normal appendix.  Mild colonic wall thickening involving the ascending colon/hepatic flexure (series 2/images 27 and 51), possibly reflecting infectious/inflammatory colitis, although without convincing surrounding inflammatory stranding.  Atherosclerotic calcifications of the abdominal aorta and branch vessels.  No abdominopelvic ascites.  No suspicious abdominopelvic lymphadenopathy.  Mild nonspecific lymphatic stranding in the jejunal mesentery (series 2/image 41).  Extensive atherosclerotic calcifications of the abdominal aorta and branch vessels.  Uterus is unremarkable.  No adnexal masses.  Bladder is within normal limits.  Numerous calcified pelvic phleboliths.  Degenerative changes of the visualized thoracolumbar spine.  IMPRESSION: Suspected mild wall thickening involving the right colon, correlate for infectious/inflammatory colitis.  No evidence of bowel obstruction.  Normal appendix.  Additional ancillary findings as above.   Original Report Authenticated By: Charline Bills, M.D.    EKG: Independently reviewed. Normal sinus rhythm. No acute changes.   Principal Problem:   Right sided colitis Active Problems:   Diabetes mellitus, type 2   CKD (chronic kidney disease) stage 3, GFR 30-59 ml/min   Hypertension   Acute on chronic renal failure   Rectal bleeding   Assessment/Plan 1. Right-sided colitis: Infectious versus inflammatory. Small amount of bleeding per rectum. Suspect secondary to colitis. Also has known diverticulosis and hemorrhoids. Check stool studies, GI pathogen panel, start empiric antibiotics. Consider GI consultation based on clinical course. 2. Rectal bleeding: Suspect  secondary to acute inflammation or infection. There is no history to suggest high-volume blood loss. At this point monitor clinically, follow CBC, clear liquids for now. 3. Acute renal failure superimposed on chronic kidney disease stage III, suspect dehydration: Etiology unclear, suspect secondary to hyperglycemia with polyuria. Possibly complicated by losartan, Relafen, . Does not appear to be on diuretics. IV fluids. Recheck in AM. 4. Marked hyperglycemia, diabetes mellitus, uncontrolled without ketosis: Scale insulin. Levemir. Hemoglobin A1c 8.4 05/2013. 5. Hypertension: Stable.  Code Status: full code  DVT prophylaxis: SCDs Family Communication: discussed with son at bedside Disposition Plan/Anticipated LOS: admission, 2-3 days  Time spent: 50 minutes  [name], MD  [facility] Pager [phone] 07/27/2013, 4:38 PM

## 2013-07-27 NOTE — Telephone Encounter (Signed)
Sitter calls to report this morning patient is having bleeding from her bottom.  "Can not tell where it is coming from"  Also blood sugar up over 300.  Reports sugar of 500 over the weekend.  Told her patient needs to go to the Emergency room ASAP

## 2013-07-27 NOTE — ED Notes (Signed)
Pt c/o bright red rectal bleeding with bowel movements and abd pain that started this am, caregiver is also concerned because pt continues to have elevated blood sugar's at home, was seen in er on 07/24/2013 for hyperglycemia, had appointment with Dr. Fransico Him this am but canceled due to rectal bleeding.

## 2013-07-28 DIAGNOSIS — I10 Essential (primary) hypertension: Secondary | ICD-10-CM

## 2013-07-28 DIAGNOSIS — K5289 Other specified noninfective gastroenteritis and colitis: Secondary | ICD-10-CM

## 2013-07-28 DIAGNOSIS — K625 Hemorrhage of anus and rectum: Secondary | ICD-10-CM

## 2013-07-28 DIAGNOSIS — K922 Gastrointestinal hemorrhage, unspecified: Secondary | ICD-10-CM

## 2013-07-28 LAB — COMPREHENSIVE METABOLIC PANEL
AST: 15 U/L (ref 0–37)
Albumin: 2.8 g/dL — ABNORMAL LOW (ref 3.5–5.2)
Calcium: 8.9 mg/dL (ref 8.4–10.5)
Creatinine, Ser: 1.69 mg/dL — ABNORMAL HIGH (ref 0.50–1.10)
GFR calc non Af Amer: 30 mL/min — ABNORMAL LOW (ref >90)
Total Protein: 6.4 g/dL (ref 6.0–8.3)

## 2013-07-28 LAB — CBC
HCT: 31.3 % — ABNORMAL LOW (ref 36.0–46.0)
HCT: 31.9 % — ABNORMAL LOW (ref 36.0–46.0)
Hemoglobin: 10 g/dL — ABNORMAL LOW (ref 12.0–15.0)
MCH: 29.3 pg (ref 26.0–34.0)
MCH: 30.2 pg (ref 26.0–34.0)
MCH: 30.1 pg (ref 26.0–34.0)
MCHC: 32.1 g/dL (ref 30.0–36.0)
MCHC: 32.2 g/dL (ref 30.0–36.0)
MCHC: 32.6 g/dL (ref 30.0–36.0)
MCV: 91.8 fL (ref 78.0–100.0)
MCV: 92.7 fL (ref 78.0–100.0)
MCV: 92.7 fL (ref 78.0–100.0)
Platelets: 254 10*3/uL (ref 150–400)
Platelets: 257 10*3/uL (ref 150–400)
RBC: 3.41 MIL/uL — ABNORMAL LOW (ref 3.87–5.11)
RDW: 13.3 % (ref 11.5–15.5)
RDW: 13.4 % (ref 11.5–15.5)
WBC: 10.6 10*3/uL — ABNORMAL HIGH (ref 4.0–10.5)

## 2013-07-28 LAB — GLUCOSE, CAPILLARY
Glucose-Capillary: 176 mg/dL — ABNORMAL HIGH (ref 70–99)
Glucose-Capillary: 120 mg/dL — ABNORMAL HIGH (ref 70–99)

## 2013-07-28 MED ORDER — METRONIDAZOLE 500 MG PO TABS
250.0000 mg | ORAL_TABLET | Freq: Three times a day (TID) | ORAL | Status: DC
  Administered 2013-07-29 (×2): 250 mg via ORAL
  Filled 2013-07-28 (×2): qty 1

## 2013-07-28 MED ORDER — CIPROFLOXACIN HCL 250 MG PO TABS
500.0000 mg | ORAL_TABLET | Freq: Two times a day (BID) | ORAL | Status: DC
  Administered 2013-07-28 – 2013-07-29 (×2): 500 mg via ORAL
  Filled 2013-07-28 (×2): qty 2

## 2013-07-28 NOTE — Progress Notes (Signed)
Patient seen and examined. Agree with note as above per [name], NP.  The patient has not had any further bloody bowel movements since last night. She does not have any abdominal pain. She is tolerating clear liquids very well without any vomiting. She wants to advance her diet. Hemoglobin has been stable. She is on antibiotics for colitis, presumed infectious. GI has been consulted. We will advance her diet to solid food. Possible discharge home later today if cleared by GI versus tomorrow.  [name]

## 2013-07-28 NOTE — Progress Notes (Signed)
[facility] PROGRESS NOTE  Kylie Tate WUJ:[id number] DOB: 31-Oct-1941 DOA: 07/27/2013 PCP: [name], MD  Assessment/Plan: 1. Right-sided colitis: Infectious versus inflammatory. Pain improved. Tolerating clear liquids well.  One more episode of BRBPR during night with small amount loose stool. Suspect secondary to colitis. Also has known diverticulosis and hemorrhoids. await stool studies, GI pathogen panel. Continue cipro and flagyl. Consider advancing diet later today.  request GI consultation given above.  2. Rectal bleeding: Suspect secondary to acute inflammation or infection. No indication of  high-volume blood loss. Hemodynamically stable. Will request GI consult given episode during night. Pt with colonoscopy 4/14 yielding diverticulosis and hemorroids.  3. Acute renal failure superimposed on chronic kidney disease stage III related to dehydration: Improving this am with IV fluids. Will continue with gentle IV fluids. Hold any nephrotoxins.  4. Marked hyperglycemia, diabetes mellitus, uncontrolled without ketosis: Scale insulin. Levemir. Hemoglobin A1c 8.4 05/2013. CBG 49 this am. Will monitor as pt diet advanced.  5. Hypertension: Stable.   Code Status: full Family Communication: daughter at bedside Disposition Plan: home when ready   Consultants:  GI  Procedures:  none  Antibiotics:  cipro 07/27/13>>>  Flagyl 07/27/13>>>  HPI/Subjective: Sitting up eating clear liquid breakfast. Denies nausea and abdominal pain. Reports 1 moderate BM during night that "was mostly blood".   Objective: Filed Vitals:   07/28/13 0524  BP: 112/54  Pulse: 73  Temp: 98 F (36.7 C)  Resp: 20    Intake/Output Summary (Last 24 hours) at 07/28/13 0844 Last data filed at 07/28/13 0836  Gross per 24 hour  Intake   2305 ml  Output    651 ml  Net   1654 ml   Filed Weights   07/27/13 1753  Weight: 74.6 kg (164 lb 7.4 oz)    Exam:   General:  Well nourished  NAD  Cardiovascular: RRR No MGR No LE edema   Respiratory: normal effort BS clear no wheeze no crackles  Abdomen: soft +BS slightly sluggish mild tenderness to palpation on right  Musculoskeletal: no clubbing no cyanosis   Data Reviewed: Basic Metabolic Panel:  Recent Labs Lab 07/24/13 1536 07/27/13 1011 07/28/13 0532  NA 133* 136 144  K 4.8 4.9 3.5  CL 98 100 112  CO2 25 21 26   GLUCOSE 440* 453* 54*  BUN 43* 57* 45*  CREATININE 1.63* 1.95* 1.69*  CALCIUM 10.0 10.2 8.9   Liver Function Tests:  Recent Labs Lab 07/27/13 1011 07/28/13 0532  AST 22 15  ALT 19 13  ALKPHOS 188* 117  BILITOT 0.5 0.4  PROT 7.4 6.4  ALBUMIN 3.5 2.8*   No results found for this basename: LIPASE, AMYLASE,  in the last 168 hours No results found for this basename: AMMONIA,  in the last 168 hours CBC:  Recent Labs Lab 07/24/13 1536 07/27/13 1011 07/27/13 1826 07/27/13 2332 07/28/13 0532  WBC 8.5 21.7* 18.6* 15.1* 13.5*  NEUTROABS 5.8 19.6*  --   --   --   HGB 10.6* 11.4* 10.5* 9.9* 10.0*  HCT 32.9* 34.6* 32.5* 29.9* 31.3*  MCV 92.2 92.0 92.3 90.9 91.8  PLT 271 299 306 252 273   Cardiac Enzymes: No results found for this basename: CKTOTAL, CKMB, CKMBINDEX, TROPONINI,  in the last 168 hours BNP (last 3 results) No results found for this basename: PROBNP,  in the last 8760 hours CBG:  Recent Labs Lab 07/24/13 1732 07/27/13 1551 07/27/13 1848 07/27/13 2142 07/28/13 0752  GLUCAP 195* 263* 375* 281*  56*    Recent Results (from the past 240 hour(s))  URINE CULTURE     Status: None   Collection Time    07/24/13  4:09 PM      Result Value Range Status   Specimen Description URINE, CLEAN CATCH   Final   Special Requests NONE   Final   Culture  Setup Time     Final   Value: 07/24/2013 20:22     Performed at [facility] Count     Final   Value: >=100,000 COLONIES/ML     Performed at [facility]   Culture     Final   Value: Multiple bacterial  morphotypes present, none predominant. Suggest appropriate recollection if clinically indicated.     Performed at [facility]   Report Status 07/26/2013 FINAL   Final     Studies: Ct Abdomen Pelvis Wo Contrast  07/27/2013   *RADIOLOGY REPORT*  Clinical Data: GI bleed  CT ABDOMEN AND PELVIS WITHOUT CONTRAST  Technique:  Multidetector CT imaging of the abdomen and pelvis was performed following the standard protocol without intravenous contrast.  Comparison: CTA abdomen pelvis dated 09/22/2010  Findings: Mild subpleural reticulation/atelectasis in the left lower lobe.  Coronary atherosclerosis.  Small hiatal hernia.  Surgical clips at the GE junction.  Unenhanced liver, spleen, and left adrenal gland are within normal limits.  Mild nodular thickening of the right adrenal gland (series 2/image 20), unchanged.  Pancreatic atrophy with associated parenchymal calcifications, suggesting sequela of prior/chronic pancreatitis.  Status post cholecystectomy.  No intrahepatic or extrahepatic ductal dilatation.  Kidneys are notable for cortical thinning bilaterally.  No renal calculi or hydronephrosis.  Vascular calcifications.  No evidence of bowel obstruction.  Normal appendix.  Mild colonic wall thickening involving the ascending colon/hepatic flexure (series 2/images 27 and 51), possibly reflecting infectious/inflammatory colitis, although without convincing surrounding inflammatory stranding.  Atherosclerotic calcifications of the abdominal aorta and branch vessels.  No abdominopelvic ascites.  No suspicious abdominopelvic lymphadenopathy.  Mild nonspecific lymphatic stranding in the jejunal mesentery (series 2/image 41).  Extensive atherosclerotic calcifications of the abdominal aorta and branch vessels.  Uterus is unremarkable.  No adnexal masses.  Bladder is within normal limits.  Numerous calcified pelvic phleboliths.  Degenerative changes of the visualized thoracolumbar spine.  IMPRESSION: Suspected mild  wall thickening involving the right colon, correlate for infectious/inflammatory colitis.  No evidence of bowel obstruction.  Normal appendix.  Additional ancillary findings as above.   Original Report Authenticated By: Charline Bills, M.D.    Scheduled Meds: . atorvastatin  10 mg Oral q1800  . ciprofloxacin  400 mg Intravenous Q24H  . cycloSPORINE  1 drop Both Eyes BID  . diazepam  5 mg Oral TID  . diltiazem  180 mg Oral q morning - 10a  . famotidine  20 mg Oral BID  . gabapentin  100 mg Oral BID  . imipramine  30 mg Oral QHS  . insulin aspart  0-15 Units Subcutaneous TID WC  . insulin aspart  0-5 Units Subcutaneous QHS  . insulin detemir  60 Units Subcutaneous QHS  . levothyroxine  50 mcg Oral QAC breakfast  . lipase/protease/amylase  2 capsule Oral TID WC  . metronidazole  500 mg Intravenous Q6H  . pantoprazole  40 mg Oral Daily   Continuous Infusions: . sodium chloride 125 mL/hr at 07/28/13 0542    Principal Problem:   Right sided colitis Active Problems:   Diabetes mellitus, type 2  CKD (chronic kidney disease) stage 3, GFR 30-59 ml/min   Hypertension   Acute on chronic renal failure   Rectal bleeding    Time spent: 30 minutes    [facility] M  [facility] Pager [phone]. If 7PM-7AM, please contact night-coverage at [email or web address], password [facility] 07/28/2013, 8:44 AM  LOS: 1 day           .TRH

## 2013-07-28 NOTE — Progress Notes (Signed)
Patient ID: Kylie Tate, female   DOB: 1941/01/18, 72 y.o.   MRN: [id number] Will add a c difficile.

## 2013-07-28 NOTE — Consult Note (Signed)
Reason for Consult colitis Referring Physician: Hospitalist AYLAH YEARY is an 72 y.o. female.  HPI:  Admitted thru the ED yesterday with c/o rectal bleeding. The bleeding started around 1am the night before. She says the amt of blood was large. She had  5-8 episodes of rectal bleeding.   She tells me now her stools are very loose but she does not see any blood.  She has has 4 loose BMs today.   She denies having a fever. No recent antibiotics.  Her BMs have been normal up until she had the rectal bleeding. There was lower abdominal pain associated with the bleeding.  Her appetite has been normal. There has been no nausea or vomiting. She is a patient of Dr. Alfonso Patten.  She underwent a CT abdomen/pelvis without CM while in the ED: IMPRESSION: Suspected mild wall thickening involving the right colon, correlate for infectious/inflammatory colitis.  No evidence of bowel obstruction.  Normal appendix.  Additional ancillary findings as above.   Original Report Authenticated By: Charline Bills, M.D.  In June she underwent a colonoscopy for family hx of colon cancer:  ENDOSCOPIC IMPRESSION:  1. Mild diverticulosis was noted in the sigmoid colon  2. Small internal hemorrhoids  3. The colon IS redundant    Hx of GERD and chronic pancreatitis and chronic renal insufficiency.  ROS   Past Medical History  Diagnosis Date  . Diabetes mellitus   . Hypertension   . GERD (gastroesophageal reflux disease)   . Hyperlipidemia   . Diverticulitis   . Thyroid disease     multinodular goiter  . Chronic pancreatitis   . Depression   . CRI (chronic renal insufficiency)   . Anemia, chronic disease   . Vertigo   . Diverticulosis     Past Surgical History  Procedure Laterality Date  . Nissen fundoplication    . Abdominal exploration surgery      abd pain-diverticulitis  . Colonoscopy  06/12/2000    Brodie-diffuse melanosis coli/mucousy exudate throughtout colon s/p bx (benign)  .  Cholecystectomy  2001  . Colonoscopy with esophagogastroduodenoscopy (egd) N/A 03/10/2013    [id number] sized internal hemorrhoids/INCOMPLETE COLONOSCOPY. EGD showed mild erosive reflux esophagitis, no Barrett's. Erosive gastritis related to aspirin and Relafen, no H. pylori.  [name] dilation N/A 03/10/2013    Procedure: [name] DILATION;  Surgeon: West Bali, MD;  Location: [facility];  Service: Endoscopy;  Laterality: N/A;  . Savory dilation N/A 03/10/2013    Procedure: SAVORY DILATION;  Surgeon: West Bali, MD;  Location: [facility];  Service: Endoscopy;  Laterality: N/A;  . Colonoscopy N/A 04/26/2013    JWJ:XBJY diverticulosis was noted in the sigmoid colon/Small internal hemorrhoids/The colon IS redundant    Family History  Problem Relation Age of Onset  . Heart disease    . Arthritis    . Cancer Brother     prostate  . Diabetes    . Heart disease Mother   . Hypertension Mother   . Hyperlipidemia Mother   . Diabetes Mother   . Depression Father   . Diabetes Father   . Diabetes Sister   . Colon cancer Neg Hx   . Pancreatitis Neg Hx     Social History:  reports that she has never smoked. She does not have any smokeless tobacco history on file. She reports that she does not drink alcohol or use illicit drugs.  Allergies: No Known Allergies  Medications: I have reviewed the patient's current medications.  Results for orders placed during the hospital encounter of 07/27/13 (from the past 48 hour(s))  CBC WITH DIFFERENTIAL     Status: Abnormal   Collection Time    07/27/13 10:11 AM      Result Value Range   WBC 21.7 (*) 4.0 - 10.5 K/uL   RBC 3.76 (*) 3.87 - 5.11 MIL/uL   Hemoglobin 11.4 (*) 12.0 - 15.0 g/dL   HCT 36.6 (*) 44.0 - 34.7 %   MCV 92.0  78.0 - 100.0 fL   MCH 30.3  26.0 - 34.0 pg   MCHC 32.9  30.0 - 36.0 g/dL   RDW 42.5  95.6 - 38.7 %   Platelets 299  150 - 400 K/uL   Neutrophils Relative % 91 (*) 43 - 77 %   Neutro Abs 19.6 (*) 1.7 - 7.7 K/uL    Lymphocytes Relative 4 (*) 12 - 46 %   Lymphs Abs 1.0  0.7 - 4.0 K/uL   Monocytes Relative 5  3 - 12 %   Monocytes Absolute 1.1 (*) 0.1 - 1.0 K/uL   Eosinophils Relative 0  0 - 5 %   Eosinophils Absolute 0.0  0.0 - 0.7 K/uL   Basophils Relative 0  0 - 1 %   Basophils Absolute 0.0  0.0 - 0.1 K/uL  BASIC METABOLIC PANEL     Status: Abnormal   Collection Time    07/27/13 10:11 AM      Result Value Range   Sodium 136  135 - 145 mEq/L   Potassium 4.9  3.5 - 5.1 mEq/L   Chloride 100  96 - 112 mEq/L   CO2 21  19 - 32 mEq/L   Glucose, Bld 453 (*) 70 - 99 mg/dL   BUN 57 (*) 6 - 23 mg/dL   Creatinine, Ser 5.64 (*) 0.50 - 1.10 mg/dL   Calcium 33.2  8.4 - 95.1 mg/dL   GFR calc non Af Amer 25 (*) >90 mL/min   GFR calc Af Amer 29 (*) >90 mL/min   Comment: (NOTE)     The eGFR has been calculated using the CKD EPI equation.     This calculation has not been validated in all clinical situations.     eGFR's persistently <90 mL/min signify possible Chronic Kidney     Disease.  HEPATIC FUNCTION PANEL     Status: Abnormal   Collection Time    07/27/13 10:11 AM      Result Value Range   Total Protein 7.4  6.0 - 8.3 g/dL   Albumin 3.5  3.5 - 5.2 g/dL   AST 22  0 - 37 U/L   ALT 19  0 - 35 U/L   Alkaline Phosphatase 188 (*) 39 - 117 U/L   Total Bilirubin 0.5  0.3 - 1.2 mg/dL   Bilirubin, Direct 0.1  0.0 - 0.3 mg/dL   Indirect Bilirubin 0.4  0.3 - 0.9 mg/dL  OCCULT BLOOD, POC DEVICE     Status: Abnormal   Collection Time    07/27/13 12:08 PM      Result Value Range   Fecal Occult Bld POSITIVE (*) NEGATIVE  GLUCOSE, CAPILLARY     Status: Abnormal   Collection Time    07/27/13  3:51 PM      Result Value Range   Glucose-Capillary 263 (*) 70 - 99 mg/dL  CBC     Status: Abnormal   Collection Time    07/27/13  6:26 PM  Result Value Range   WBC 18.6 (*) 4.0 - 10.5 K/uL   RBC 3.52 (*) 3.87 - 5.11 MIL/uL   Hemoglobin 10.5 (*) 12.0 - 15.0 g/dL   HCT 16.1 (*) 09.6 - 04.5 %   MCV 92.3  78.0  - 100.0 fL   MCH 29.8  26.0 - 34.0 pg   MCHC 32.3  30.0 - 36.0 g/dL   RDW 40.9  81.1 - 91.4 %   Platelets 306  150 - 400 K/uL  GLUCOSE, CAPILLARY     Status: Abnormal   Collection Time    07/27/13  6:48 PM      Result Value Range   Glucose-Capillary 375 (*) 70 - 99 mg/dL   Comment 1 Notify RN    GLUCOSE, CAPILLARY     Status: Abnormal   Collection Time    07/27/13  9:42 PM      Result Value Range   Glucose-Capillary 281 (*) 70 - 99 mg/dL  CBC     Status: Abnormal   Collection Time    07/27/13 11:32 PM      Result Value Range   WBC 15.1 (*) 4.0 - 10.5 K/uL   RBC 3.29 (*) 3.87 - 5.11 MIL/uL   Hemoglobin 9.9 (*) 12.0 - 15.0 g/dL   HCT 78.2 (*) 95.6 - 21.3 %   MCV 90.9  78.0 - 100.0 fL   MCH 30.1  26.0 - 34.0 pg   MCHC 33.1  30.0 - 36.0 g/dL   RDW 08.6  57.8 - 46.9 %   Platelets 252  150 - 400 K/uL  COMPREHENSIVE METABOLIC PANEL     Status: Abnormal   Collection Time    07/28/13  5:32 AM      Result Value Range   Sodium 144  135 - 145 mEq/L   Comment: DELTA CHECK NOTED   Potassium 3.5  3.5 - 5.1 mEq/L   Chloride 112  96 - 112 mEq/L   CO2 26  19 - 32 mEq/L   Glucose, Bld 54 (*) 70 - 99 mg/dL   BUN 45 (*) 6 - 23 mg/dL   Creatinine, Ser 6.29 (*) 0.50 - 1.10 mg/dL   Calcium 8.9  8.4 - 52.8 mg/dL   Total Protein 6.4  6.0 - 8.3 g/dL   Albumin 2.8 (*) 3.5 - 5.2 g/dL   AST 15  0 - 37 U/L   ALT 13  0 - 35 U/L   Alkaline Phosphatase 117  39 - 117 U/L   Total Bilirubin 0.4  0.3 - 1.2 mg/dL   GFR calc non Af Amer 30 (*) >90 mL/min   GFR calc Af Amer 34 (*) >90 mL/min   Comment: (NOTE)     The eGFR has been calculated using the CKD EPI equation.     This calculation has not been validated in all clinical situations.     eGFR's persistently <90 mL/min signify possible Chronic Kidney     Disease.  CBC     Status: Abnormal   Collection Time    07/28/13  5:32 AM      Result Value Range   WBC 13.5 (*) 4.0 - 10.5 K/uL   RBC 3.41 (*) 3.87 - 5.11 MIL/uL   Hemoglobin 10.0 (*) 12.0  - 15.0 g/dL   HCT 41.3 (*) 24.4 - 01.0 %   MCV 91.8  78.0 - 100.0 fL   MCH 29.3  26.0 - 34.0 pg   MCHC 31.9  30.0 - 36.0  g/dL   RDW 14.7  82.9 - 56.2 %   Platelets 273  150 - 400 K/uL  GLUCOSE, CAPILLARY     Status: Abnormal   Collection Time    07/28/13  7:52 AM      Result Value Range   Glucose-Capillary 49 (*) 70 - 99 mg/dL   Comment 1 Notify RN    GLUCOSE, CAPILLARY     Status: Abnormal   Collection Time    07/28/13  8:59 AM      Result Value Range   Glucose-Capillary 176 (*) 70 - 99 mg/dL  GLUCOSE, CAPILLARY     Status: Abnormal   Collection Time    07/28/13 11:29 AM      Result Value Range   Glucose-Capillary 134 (*) 70 - 99 mg/dL   Comment 1 Notify RN    CBC     Status: Abnormal   Collection Time    07/28/13 11:41 AM      Result Value Range   WBC 12.0 (*) 4.0 - 10.5 K/uL   RBC 3.44 (*) 3.87 - 5.11 MIL/uL   Hemoglobin 10.4 (*) 12.0 - 15.0 g/dL   HCT 13.0 (*) 86.5 - 78.4 %   MCV 92.7  78.0 - 100.0 fL   MCH 30.2  26.0 - 34.0 pg   MCHC 32.6  30.0 - 36.0 g/dL   RDW 69.6  29.5 - 28.4 %   Platelets 249  150 - 400 K/uL    Ct Abdomen Pelvis Wo Contrast  07/27/2013   *RADIOLOGY REPORT*  Clinical Data: GI bleed  CT ABDOMEN AND PELVIS WITHOUT CONTRAST  Technique:  Multidetector CT imaging of the abdomen and pelvis was performed following the standard protocol without intravenous contrast.  Comparison: CTA abdomen pelvis dated 09/22/2010  Findings: Mild subpleural reticulation/atelectasis in the left lower lobe.  Coronary atherosclerosis.  Small hiatal hernia.  Surgical clips at the GE junction.  Unenhanced liver, spleen, and left adrenal gland are within normal limits.  Mild nodular thickening of the right adrenal gland (series 2/image 20), unchanged.  Pancreatic atrophy with associated parenchymal calcifications, suggesting sequela of prior/chronic pancreatitis.  Status post cholecystectomy.  No intrahepatic or extrahepatic ductal dilatation.  Kidneys are notable for cortical  thinning bilaterally.  No renal calculi or hydronephrosis.  Vascular calcifications.  No evidence of bowel obstruction.  Normal appendix.  Mild colonic wall thickening involving the ascending colon/hepatic flexure (series 2/images 27 and 51), possibly reflecting infectious/inflammatory colitis, although without convincing surrounding inflammatory stranding.  Atherosclerotic calcifications of the abdominal aorta and branch vessels.  No abdominopelvic ascites.  No suspicious abdominopelvic lymphadenopathy.  Mild nonspecific lymphatic stranding in the jejunal mesentery (series 2/image 41).  Extensive atherosclerotic calcifications of the abdominal aorta and branch vessels.  Uterus is unremarkable.  No adnexal masses.  Bladder is within normal limits.  Numerous calcified pelvic phleboliths.  Degenerative changes of the visualized thoracolumbar spine.  IMPRESSION: Suspected mild wall thickening involving the right colon, correlate for infectious/inflammatory colitis.  No evidence of bowel obstruction.  Normal appendix.  Additional ancillary findings as above.   Original Report Authenticated By: Charline Bills, M.D.   I/O last 3 completed shifts: In: 2065 [P.O.:240; I.V.:1525; IV Piggyback:300] Out: 300 [Urine:300] Total I/O In: 1214.2 [P.O.:360; I.V.:654.2; IV Piggyback:200] Out: 751 [Urine:750; Stool:1]     ROS Blood pressure 118/65, pulse 72, temperature 97.9 F (36.6 C), temperature source Oral, resp. rate 20, height 5\' 4"  (1.626 m), weight 164 lb 7.4 oz (74.6 kg), SpO2 97.00%. Physical  Exam Alert and oriented. Skin warm and dry. Oral mucosa is moist.   . Sclera anicteric, conjunctivae is pink. Thyroid not enlarged. No cervical lymphadenopathy. Lungs clear. Heart regular rate and rhythm.  Abdomen is soft. Bowel sounds are positive. No hepatomegaly. No abdominal masses felt. No tenderness.  No edema to lower extremities.    Assessment/Plan: Rectal bleeding. Colitis. Possible infectious in nature.    Hemoglobin is stable. Doubt this is a diverticular bleed.  Plan: GI pathogen and  stool studies pending. Agree with Cipro and Flagyl IV at this time.  Will add a c-difficile.    SETZER,TERRI W 07/28/2013, 4:29 PM   GI attending note; Patient interviewed and examined. Abdominopelvic CT reviewed. I also reviewed the EGD and colonoscopy records from April 2014 and repeat colonoscopy records from June 2014. First colonoscopy in April 2014 was incomplete secondary to poor prep. Patient's acute symptoms complex appears to be secondary to an infection. She appears to be improving with current therapy. At this point there is no need or indication for endoscopic evaluation. Will transition to oral antibiotics.

## 2013-07-28 NOTE — Care Management Note (Unsigned)
    Page 1 of 1   07/28/2013     1:38:59 PM   CARE MANAGEMENT NOTE 07/28/2013  Patient:  Kylie Tate, Kylie Tate   Account Number:  [id number]  Date Initiated:  07/28/2013  Documentation initiated by:  Rosemary Holms  Subjective/Objective Assessment:   Pt lives at home alone. States she has a caregiver who stays with her during the week and her children stay with her on the weekends. CG at bedside. Plans to DC with the same arrangement. No DME needs identified     Action/Plan:   Anticipated DC Date:     Anticipated DC Plan:  HOME/SELF CARE      DC Planning Services  CM consult      Choice offered to / List presented to:             Status of service:  In process, will continue to follow Medicare Important Message given?   (If response is "NO", the following Medicare IM given date fields will be blank) Date Medicare IM given:   Date Additional Medicare IM given:    Discharge Disposition:    Per UR Regulation:    If discussed at Long Length of Stay Meetings, dates discussed:    Comments:  07/28/13 Rosemary Holms RN BSN CM

## 2013-07-29 ENCOUNTER — Telehealth (HOSPITAL_COMMUNITY): Payer: Self-pay | Admitting: Emergency Medicine

## 2013-07-29 DIAGNOSIS — K625 Hemorrhage of anus and rectum: Secondary | ICD-10-CM

## 2013-07-29 DIAGNOSIS — K5289 Other specified noninfective gastroenteritis and colitis: Secondary | ICD-10-CM

## 2013-07-29 DIAGNOSIS — I10 Essential (primary) hypertension: Secondary | ICD-10-CM

## 2013-07-29 DIAGNOSIS — R197 Diarrhea, unspecified: Secondary | ICD-10-CM

## 2013-07-29 LAB — GI PATHOGEN PANEL BY PCR, STOOL
Campylobacter by PCR: NEGATIVE
E coli (ETEC) LT/ST: NEGATIVE
E coli (STEC): NEGATIVE
Norovirus GI/GII: NEGATIVE

## 2013-07-29 LAB — BASIC METABOLIC PANEL
BUN: 24 mg/dL — ABNORMAL HIGH (ref 6–23)
Chloride: 116 mEq/L — ABNORMAL HIGH (ref 96–112)
GFR calc Af Amer: 43 mL/min — ABNORMAL LOW (ref >90)
Potassium: 3.7 mEq/L (ref 3.5–5.1)
Sodium: 146 mEq/L — ABNORMAL HIGH (ref 135–145)

## 2013-07-29 LAB — GLUCOSE, CAPILLARY: Glucose-Capillary: 125 mg/dL — ABNORMAL HIGH (ref 70–99)

## 2013-07-29 LAB — CBC
Hemoglobin: 10.1 g/dL — ABNORMAL LOW (ref 12.0–15.0)
MCH: 30 pg (ref 26.0–34.0)
MCV: 94 fL (ref 78.0–100.0)
Platelets: 246 10*3/uL (ref 150–400)
Platelets: 264 10*3/uL (ref 150–400)
RBC: 3.37 MIL/uL — ABNORMAL LOW (ref 3.87–5.11)
RBC: 3.65 MIL/uL — ABNORMAL LOW (ref 3.87–5.11)
RDW: 13.2 % (ref 11.5–15.5)
WBC: 8.5 10*3/uL (ref 4.0–10.5)
WBC: 8.3 10*3/uL (ref 4.0–10.5)

## 2013-07-29 LAB — FECAL LACTOFERRIN, QUANT: Fecal Lactoferrin: POSITIVE

## 2013-07-29 MED ORDER — CIPROFLOXACIN HCL 500 MG PO TABS: 500.0000 mg | ORAL_TABLET | Freq: Two times a day (BID) | ORAL | Status: AC

## 2013-07-29 MED ORDER — METRONIDAZOLE 250 MG PO TABS: 250.0000 mg | ORAL_TABLET | Freq: Three times a day (TID) | ORAL | Status: AC

## 2013-07-29 NOTE — Discharge Summary (Signed)
Patient seen and examined. Agree with note as per [name], NP.  Rectal bleeding felt to be due to infectious colitis.  Bleeding has since resolved.  Hemoglobin is stable and she has not required transfusion.  Follow up with GI as outpatient.  Ambermarie Honeyman

## 2013-07-29 NOTE — ED Provider Notes (Signed)
Medical screening examination/treatment/procedure(s) were performed by non-physician practitioner and as supervising physician I was immediately available for consultation/collaboration.   Benny Lennert, MD 07/29/13 [phone]

## 2013-07-29 NOTE — Progress Notes (Signed)
Subjective:  Feels better. Ready to go home. No BM since yesterday. Last one was small and without blood. No abdominal pain.   Objective: Vital signs in last 24 hours: Temp:  [97.9 F (36.6 C)-98.6 F (37 C)] 98.3 F (36.8 C) (09/11 0546) Pulse Rate:  [66-72] 66 (09/11 0546) Resp:  [20] 20 (09/11 0546) BP: (118-123)/(65-69) 123/69 mmHg (09/11 0546) SpO2:  [94 %-98 %] 98 % (09/11 0546) Last BM Date: 07/27/13 General:   Alert,  Well-developed, well-nourished, pleasant and cooperative in NAD Head:  Normocephalic and atraumatic. Eyes:  Sclera clear, no icterus.   Abdomen:  Soft, nontender and nondistended. No masses, hepatosplenomegaly or hernias noted. Normal bowel sounds, without guarding, and without rebound.   Extremities:  Without clubbing, deformity or edema. Neurologic:  Alert and  oriented x4;  grossly normal neurologically. Skin:  Intact without significant lesions or rashes. Psych:  Alert and cooperative. Normal mood and affect.  Intake/Output from previous day: 09/10 0701 - 09/11 0700 In: 1909.2 [P.O.:600; I.V.:909.2; IV Piggyback:400] Out: 1751 [Urine:1750; Stool:1] Intake/Output this shift:    Lab Results: CBC  Recent Labs  07/28/13 1838 07/28/13 2347 07/29/13 0553  WBC 9.6 10.6* 8.5  HGB 10.6* 9.9* 10.1*  HCT 33.0* 30.7* 31.3*  MCV 93.8 92.7 92.9  PLT 257 254 246   BMET  Recent Labs  07/27/13 1011 07/28/13 0532 07/29/13 0553  NA 136 144 146*  K 4.9 3.5 3.7  CL 100 112 116*  CO2 21 26 24   GLUCOSE 453* 54* 51*  BUN 57* 45* 24*  CREATININE 1.95* 1.69* 1.40*  CALCIUM 10.2 8.9 8.5   LFTs  Recent Labs  07/27/13 1011 07/28/13 0532  BILITOT 0.5 0.4  BILIDIR 0.1  --   IBILI 0.4  --   ALKPHOS 188* 117  AST 22 15  ALT 19 13  PROT 7.4 6.4  ALBUMIN 3.5 2.8*   CDiff PCR neg GI Pathogen panel pending    Imaging Studies: Ct Abdomen Pelvis Wo Contrast  07/27/2013   *RADIOLOGY REPORT*  Clinical Data: GI bleed  CT ABDOMEN AND PELVIS WITHOUT  CONTRAST  Technique:  Multidetector CT imaging of the abdomen and pelvis was performed following the standard protocol without intravenous contrast.  Comparison: CTA abdomen pelvis dated 09/22/2010  Findings: Mild subpleural reticulation/atelectasis in the left lower lobe.  Coronary atherosclerosis.  Small hiatal hernia.  Surgical clips at the GE junction.  Unenhanced liver, spleen, and left adrenal gland are within normal limits.  Mild nodular thickening of the right adrenal gland (series 2/image 20), unchanged.  Pancreatic atrophy with associated parenchymal calcifications, suggesting sequela of prior/chronic pancreatitis.  Status post cholecystectomy.  No intrahepatic or extrahepatic ductal dilatation.  Kidneys are notable for cortical thinning bilaterally.  No renal calculi or hydronephrosis.  Vascular calcifications.  No evidence of bowel obstruction.  Normal appendix.  Mild colonic wall thickening involving the ascending colon/hepatic flexure (series 2/images 27 and 51), possibly reflecting infectious/inflammatory colitis, although without convincing surrounding inflammatory stranding.  Atherosclerotic calcifications of the abdominal aorta and branch vessels.  No abdominopelvic ascites.  No suspicious abdominopelvic lymphadenopathy.  Mild nonspecific lymphatic stranding in the jejunal mesentery (series 2/image 41).  Extensive atherosclerotic calcifications of the abdominal aorta and branch vessels.  Uterus is unremarkable.  No adnexal masses.  Bladder is within normal limits.  Numerous calcified pelvic phleboliths.  Degenerative changes of the visualized thoracolumbar spine.  IMPRESSION: Suspected mild wall thickening involving the right colon, correlate for infectious/inflammatory colitis.  No evidence of bowel  obstruction.  Normal appendix.  Additional ancillary findings as above.   Original Report Authenticated By: Charline Bills, M.D.    Assessment: Acute onset loose stool associated with bleeding. CT  with ?right sided colitis. CDiff PCR negative. GI pathogen panel pending. H/H stable. Clinically patient much improved without further bleeding. Suspect infectious etiology.    Plan: 1. Ok to discharge from GI standpoint. We will make arrangements for f/u with Dr. Darrick Penna.  2. Complete course of cipro/flagyl. 3. F/u gi pathogen panel as available.   LOS: 2 days   Tana Coast  07/29/2013, 9:13 AM

## 2013-07-29 NOTE — Discharge Summary (Signed)
Physician Discharge Summary  AZURE BUDNICK FAO:[id number] DOB: [birth year] DOA: 07/27/2013  PCP: [name], MD  Admit date: 07/27/2013 Discharge date: 07/29/2013  Time spent: 40 minutes minutes  Recommendations for Outpatient Follow-up:  Follow up with PCP 1 week for evaluation of symptoms Dr. Darrick Penna office will contact for follow up appointment  Discharge Diagnoses:  Principal Problem:   Right sided colitis Active Problems:   Diabetes mellitus, type 2   CKD (chronic kidney disease) stage 3, GFR 30-59 ml/min   Hypertension   Acute on chronic renal failure   Rectal bleeding   Discharge Condition: stable  Diet recommendation: carb modified  Filed Weights   07/27/13 1753  Weight: 74.6 kg (164 lb 7.4 oz)    History of present illness:  72 year old woman with history of diverticulosis of the sigmoid colon, small internal hemorrhoids who presented the emergency department on 07/27/13 with sudden onset of diarrhea, abdominal pain and rectal bleeding in the morning. Initial evaluation was notable for right-sided colitis CT abdomen and pelvis indicated on chronic renal failure. History obtained from patient and chart. She had been overall doing well of late except blood sugars had been running high. She had no abdominal pain or bowel symptoms the day prior to presentation. Approximately 2 AM she awoke and went to the bathroom. She had multiple episodes of diarrhea, the last 2 with some blood seen. She also had lower abdominal pain which she described as an ache, roughly moderate intensity. There had been no vomiting. No other complaints. In emergency department afebrile, vital signs stable. BUN and creatinine were elevated above baseline 57/1.95. Glucose elevated 453. Normal bicarbonate. White blood cell count 21.7. EKG was nonacute.      Hospital Course:  1. Right-sided colitis: Likely infectious versus inflammatory. Pt admitted and provided with cipro and flagyl as well as clear  liquids. She quickly improved with only 1 additional bloody stool the night of admission. He pain abated quickly. c diff negative. GI pathogen panel pending at discharge. Seen by GI who opined likely infectious colitis and recommended completing cipro and flagyl. No colonoscopy indicated. Her diet was advanced which she tolerated well. Recommend following up with PCP 1 week for evaluation of symptoms. GI office will contact for follow up appointment. 2. Rectal bleeding: Suspect secondary to infection. No indication of high-volume blood loss. She remained hemodynamically stable. Of note, pt with colonoscopy 4/14 yielding diverticulosis and hemorroids.  3. Acute renal failure superimposed on chronic kidney disease stage III related to dehydration: Gently hydrated with IV fluids. At discharge creatinine 1.4 which is her baseline.   4. Marked hyperglycemia, diabetes mellitus, uncontrolled without ketosis:  Levemir. Hemoglobin A1c 8.4 05/2013. Will resume home regimen  5. Hypertension: controlled during this hospitalization.   Procedures:  none  Consultations:  Gastroenterology  Discharge Exam: Filed Vitals:   07/29/13 0546  BP: 123/69  Pulse: 66  Temp: 98.3 F (36.8 C)  Resp: 20    General: well nourished NAD Cardiovascular: RRR No MGR No LE edema Respiratory: normal effort BS clear bilaterally no wheeze Abdomen: soft, +BS x4. Non-tender to palpation.   Discharge Instructions   Future Appointments Provider Department Dept Phone   10/19/2013 11:00 AM Salley Scarlet, MD [facility] [phone]       Medication List         aspirin EC 81 MG tablet  Take 81 mg by mouth daily. For Blood Thinner     atorvastatin 10 MG tablet  Commonly known  as:  LIPITOR  Take 10 mg by mouth every morning. For Cholesterol     CENTRUM PO  Take 1 tablet by mouth daily.     ciprofloxacin 500 MG tablet  Commonly known as:  CIPRO  Take 1 tablet (500 mg total) by mouth 2 (two)  times daily.     CREON 12000 UNITS Cpep capsule  Generic drug:  lipase/protease/amylase  Take 2 capsules by mouth 3 (three) times daily. Takes 2 capsules with meals and 1 with snacks.     cycloSPORINE 0.05 % ophthalmic emulsion  Commonly known as:  RESTASIS  Place 1 drop into both eyes 2 (two) times daily.     diazepam 5 MG tablet  Commonly known as:  VALIUM  Take 5 mg by mouth 3 (three) times daily.     diltiazem 180 MG 24 hr capsule  Commonly known as:  CARDIZEM CD  Take 180 mg by mouth every morning. For Blood Pressure     fish oil-omega-3 fatty acids 1000 MG capsule  Take 1 capsule (1 g total) by mouth 2 (two) times daily.     gabapentin 100 MG capsule  Commonly known as:  NEURONTIN  Take 100 mg by mouth 2 (two) times daily. Neuropathy     imipramine 10 MG tablet  Commonly known as:  TOFRANIL  Take 30 mg by mouth at bedtime. For sleep and for mood     insulin aspart 100 UNIT/ML injection  Commonly known as:  novoLOG  - Inject 16-20 Units into the skin See admin instructions. Injects 15 units in addition to sliding scale as directed:  -   - 151-200= 16 units  - 201-250= 17 units  - 251-300= 18 units  - 301-350= 19 units  - 351-400= 20 units  - 401/more= 21 units     insulin detemir 100 UNIT/ML injection  Commonly known as:  LEVEMIR  Inject 60 Units into the skin at bedtime.     levothyroxine 50 MCG tablet  Commonly known as:  SYNTHROID, LEVOTHROID  Take 50 mcg by mouth daily.     losartan 50 MG tablet  Commonly known as:  COZAAR  Take 50 mg by mouth daily.     metroNIDAZOLE 250 MG tablet  Commonly known as:  FLAGYL  Take 1 tablet (250 mg total) by mouth 3 (three) times daily after meals.     nabumetone 500 MG tablet  Commonly known as:  RELAFEN  Take 500 mg by mouth 2 (two) times daily.     omeprazole 20 MG capsule  Commonly known as:  PRILOSEC  Take 20 mg by mouth daily. For Acid Reflux/GERD     ranitidine 150 MG tablet  Commonly known as:   ZANTAC  Take 150 mg by mouth daily.       No Known Allergies    The results of significant diagnostics from this hospitalization (including imaging, microbiology, ancillary and laboratory) are listed below for reference.    Significant Diagnostic Studies: Ct Abdomen Pelvis Wo Contrast  07/27/2013   *RADIOLOGY REPORT*  Clinical Data: GI bleed  CT ABDOMEN AND PELVIS WITHOUT CONTRAST  Technique:  Multidetector CT imaging of the abdomen and pelvis was performed following the standard protocol without intravenous contrast.  Comparison: CTA abdomen pelvis dated 09/22/2010  Findings: Mild subpleural reticulation/atelectasis in the left lower lobe.  Coronary atherosclerosis.  Small hiatal hernia.  Surgical clips at the GE junction.  Unenhanced liver, spleen, and left adrenal gland are within normal limits.  Mild nodular thickening of the right adrenal gland (series 2/image 20), unchanged.  Pancreatic atrophy with associated parenchymal calcifications, suggesting sequela of prior/chronic pancreatitis.  Status post cholecystectomy.  No intrahepatic or extrahepatic ductal dilatation.  Kidneys are notable for cortical thinning bilaterally.  No renal calculi or hydronephrosis.  Vascular calcifications.  No evidence of bowel obstruction.  Normal appendix.  Mild colonic wall thickening involving the ascending colon/hepatic flexure (series 2/images 27 and 51), possibly reflecting infectious/inflammatory colitis, although without convincing surrounding inflammatory stranding.  Atherosclerotic calcifications of the abdominal aorta and branch vessels.  No abdominopelvic ascites.  No suspicious abdominopelvic lymphadenopathy.  Mild nonspecific lymphatic stranding in the jejunal mesentery (series 2/image 41).  Extensive atherosclerotic calcifications of the abdominal aorta and branch vessels.  Uterus is unremarkable.  No adnexal masses.  Bladder is within normal limits.  Numerous calcified pelvic phleboliths.  Degenerative  changes of the visualized thoracolumbar spine.  IMPRESSION: Suspected mild wall thickening involving the right colon, correlate for infectious/inflammatory colitis.  No evidence of bowel obstruction.  Normal appendix.  Additional ancillary findings as above.   Original Report Authenticated By: Charline Bills, M.D.   Dg Esophagus  07/16/2013   *RADIOLOGY REPORT*  Clinical Data: Dysphagia after endoscopy, difficulty swallowing food, having to wash food down with water, painful swallowing  ESOPHAGUS/BARIUM SWALLOW/TABLET STUDY:  Technique:  Single contrast, air contrast, and tablet imaging of the esophagus were performed.  Fluoroscopy time:  2 minutes 18 seconds  Comparison: None  Findings: Normal esophageal distention. No esophageal mass or stricture. Slight narrowing of the distal thoracic esophagus is identified though a 12.5 mm diameter barium tablet was able to pass beyond this into the stomach without delay or obstruction. Diffuse age-related impairment of esophageal motility. No persistent intraluminal filling defects. Targeted rapid sequence imaging of the cervical esophagus and hypopharynx demonstrated laryngeal penetration without aspiration. Prominent cricopharyngeus muscle is identified with question tiny Zenker diverticulum. No gastroesophageal reflux identified during exam. Smooth appearance of esophageal mucosa on air-contrast imaging. Moderate sized hiatal hernia, primarily on RAO prone imaging.  IMPRESSION: Moderate sized hiatal hernia. No evidence of esophageal obstruction. Laryngeal penetration without aspiration. Prominent cricopharyngeus muscle with question tiny Zenker diverticulum.   Original Report Authenticated By: Ulyses Southward, M.D.    Microbiology: Recent Results (from the past 240 hour(s))  URINE CULTURE     Status: None   Collection Time    07/24/13  4:09 PM      Result Value Range Status   Specimen Description URINE, CLEAN CATCH   Final   Special Requests NONE   Final    Culture  Setup Time     Final   Value: 07/24/2013 20:22     Performed at [facility] Count     Final   Value: >=100,000 COLONIES/ML     Performed at [facility]   Culture     Final   Value: Multiple bacterial morphotypes present, none predominant. Suggest appropriate recollection if clinically indicated.     Performed at [facility]   Report Status 07/26/2013 FINAL   Final  CLOSTRIDIUM DIFFICILE BY PCR     Status: None   Collection Time    07/28/13  2:50 PM      Result Value Range Status   C difficile by pcr NEGATIVE  NEGATIVE Final     Labs: Basic Metabolic Panel:  Recent Labs Lab 07/24/13 1536 07/27/13 1011 07/28/13 0532 07/29/13 0553  NA 133* 136 144 146*  K 4.8  4.9 3.5 3.7  CL 98 100 112 116*  CO2 25 21 26 24   GLUCOSE 440* 453* 54* 51*  BUN 43* 57* 45* 24*  CREATININE 1.63* 1.95* 1.69* 1.40*  CALCIUM 10.0 10.2 8.9 8.5   Liver Function Tests:  Recent Labs Lab 07/27/13 1011 07/28/13 0532  AST 22 15  ALT 19 13  ALKPHOS 188* 117  BILITOT 0.5 0.4  PROT 7.4 6.4  ALBUMIN 3.5 2.8*   No results found for this basename: LIPASE, AMYLASE,  in the last 168 hours No results found for this basename: AMMONIA,  in the last 168 hours CBC:  Recent Labs Lab 07/24/13 1536 07/27/13 1011  07/28/13 0532 07/28/13 1141 07/28/13 1838 07/28/13 2347 07/29/13 0553  WBC 8.5 21.7*  < > 13.5* 12.0* 9.6 10.6* 8.5  NEUTROABS 5.8 19.6*  --   --   --   --   --   --   HGB 10.6* 11.4*  < > 10.0* 10.4* 10.6* 9.9* 10.1*  HCT 32.9* 34.6*  < > 31.3* 31.9* 33.0* 30.7* 31.3*  MCV 92.2 92.0  < > 91.8 92.7 93.8 92.7 92.9  PLT 271 299  < > 273 249 257 254 246  < > = values in this interval not displayed. Cardiac Enzymes: No results found for this basename: CKTOTAL, CKMB, CKMBINDEX, TROPONINI,  in the last 168 hours BNP: BNP (last 3 results) No results found for this basename: PROBNP,  in the last 8760 hours CBG:  Recent Labs Lab 07/28/13 1129  07/28/13 1649 07/28/13 2048 07/29/13 0759 07/29/13 0818  GLUCAP 134* 138* 120* 47* 75       Signed:  BLACK,KAREN M  [facility] 07/29/2013, 10:18 AM

## 2013-07-29 NOTE — Progress Notes (Signed)
Inpatient Diabetes Program Recommendations  AACE/ADA: New Consensus Statement on Inpatient Glycemic Control (2013)  Target Ranges:  Prepandial:   less than 140 mg/dL      Peak postprandial:   less than 180 mg/dL (1-2 hours)      Critically ill patients:  140 - 180 mg/dL   Results for TAYEN, NARANG (MRN [id number]) as of 07/29/2013 08:24  Ref. Range 07/28/2013 07:52 07/28/2013 08:59 07/28/2013 11:29 07/28/2013 16:49 07/28/2013 20:48  Glucose-Capillary Latest Range: 70-99 mg/dL 49 (L) 409 (H) 811 (H) 138 (H) 120 (H)  Results for ROMANITA, FAGER (MRN [id number]) as of 07/29/2013 08:24  Ref. Range 07/29/2013 07:59 07/29/2013 08:18  Glucose-Capillary Latest Range: 70-99 mg/dL 47 (L) 75   Inpatient Diabetes Program Recommendations Insulin - Basal: While inpatient, please consider decreasing Levemir to 58 units QHS.  Note: Patient has a history of diabetes and takes Levemir 60 units QHS and Novolog 16-20 units TID with meals as an outpatient for diabetes management.  Currently, patient is ordered to receive Levemir 60 units QHS, Novolog 0-15 units AC, and Novolog 0-5 units HS for inpatient glycemic control.  Patient has experienced hypoglycemia with fasting blood glucose over the past 2 mornings.  Please consider decreasing Levemir to 58 units QHS while inpatient.  Will continue to follow.  Thanks, Orlando Penner, RN, MSN, CCRN Diabetes Coordinator Inpatient Diabetes Program [phone] (Team Pager) [phone] ([facility] office) [phone] [facility] office)

## 2013-07-29 NOTE — Progress Notes (Signed)
Quick Note:  Discussed with patient today at hospital. Inpatient. No esophageal stricture. Slight narrowing at distal thoracic esophagus, age related esophageal motility, ?zenker (likely asymptomatic). Recommend chewing food thoroughly, adequate fluid with meals.  She needs f/u OV with Dr. Darrick Penna only in 4-6 weeks (hospital f/u, dysphagia, celiac artery stenosis). ______

## 2013-07-29 NOTE — Progress Notes (Signed)
Lab called to report patient had positive Rotovirus A.  Patient had been discharged earlier today.  Patient not on floor. Notified hospitalist on call of results.  No response at this time.

## 2013-07-30 NOTE — ED Notes (Signed)
Patient contacted and was informed to make sure she stayed hydrated per Felicie Morn.She is feeling better.

## 2013-08-01 LAB — STOOL CULTURE

## 2013-08-02 ENCOUNTER — Encounter: Payer: Self-pay | Admitting: Family Medicine

## 2013-08-02 ENCOUNTER — Ambulatory Visit (INDEPENDENT_AMBULATORY_CARE_PROVIDER_SITE_OTHER): Payer: Medicare Other | Admitting: Family Medicine

## 2013-08-02 VITALS — BP 112/60 | HR 68 | Temp 97.4°F | Resp 18 | Wt 166.0 lb

## 2013-08-02 DIAGNOSIS — E119 Type 2 diabetes mellitus without complications: Secondary | ICD-10-CM

## 2013-08-02 DIAGNOSIS — Z23 Encounter for immunization: Secondary | ICD-10-CM

## 2013-08-02 DIAGNOSIS — K5900 Constipation, unspecified: Secondary | ICD-10-CM

## 2013-08-02 DIAGNOSIS — K529 Noninfective gastroenteritis and colitis, unspecified: Secondary | ICD-10-CM

## 2013-08-02 DIAGNOSIS — N183 Chronic kidney disease, stage 3 unspecified: Secondary | ICD-10-CM

## 2013-08-02 DIAGNOSIS — K5289 Other specified noninfective gastroenteritis and colitis: Secondary | ICD-10-CM

## 2013-08-02 NOTE — Assessment & Plan Note (Signed)
NO BM in 1 week, now bloated, miralax today and tomorrow, her CNA will call office if she does not have BM

## 2013-08-02 NOTE — Progress Notes (Signed)
Done

## 2013-08-02 NOTE — Progress Notes (Signed)
Quick Note:  Routing to [facility] for appt in 4-6 weeks with Dr. Darrick Penna. ______

## 2013-08-02 NOTE — Progress Notes (Signed)
  Subjective:    Patient ID: Kylie Tate, female    DOB: [birth year], 72 y.o.   MRN: [id number]  HPI  Pt here to f/u recent admission for right sided colitis and hyperglycemia. Seen by GI, no scope needed during admission, no transfusion needed. Completing course of cipro/flagyl. No abd pain currently. Has not had BM in 1 week, feels bloated, no further blood from rectum  DM- CBG fasting 225-324, before lunch 135-310, before dinner 92-285, bedtime 66-263, levemir 60 units, 15 units QAC +SSI, no hypoglycemia, no polydipsia, or polyuria   Review of Systems  GEN- denies fatigue, fever, weight loss,weakness, recent illness HEENT- denies eye drainage, change in vision, nasal discharge, CVS- denies chest pain, palpitations RESP- denies SOB, cough, wheeze ABD- denies N/V, change in stools, abd pain GU- denies dysuria, hematuria, dribbling, incontinence MSK- denies joint pain, muscle aches, injury Neuro- denies headache, dizziness, syncope, seizure activity       Objective:   Physical Exam GEN- NAD, alert and oriented x3 HEENT- PERRL, EOMI, non injected sclera, pink conjunctiva, MMM, oropharynx clear CVS- RRR,1/6 Systolic murmur RESP-CTAB ABD-NABS,soft,NT,ND EXT- No edema Pulses- Radial 2+       Assessment & Plan:

## 2013-08-02 NOTE — Assessment & Plan Note (Signed)
A1C improved, CBG still high, but much improved to previous Continue SSI, per endocrine

## 2013-08-02 NOTE — Assessment & Plan Note (Signed)
Recheck renal function, mild acute on chronic RI

## 2013-08-02 NOTE — Patient Instructions (Signed)
Try the oatmeal and prune juice Give a dose of miralax today and tomorrow if no bowel movement  Continue all  Other medications Schedule an appt with Dr. Darrick Penna office  F/U 4 weeks ( 30 minute slot)

## 2013-08-02 NOTE — Assessment & Plan Note (Signed)
Improved, complete antibiotics, f/U GI

## 2013-08-03 LAB — CBC WITH DIFFERENTIAL/PLATELET
Eosinophils Relative: 1 % (ref 0–5)
HCT: 32.5 % — ABNORMAL LOW (ref 36.0–46.0)
Hemoglobin: 10.3 g/dL — ABNORMAL LOW (ref 12.0–15.0)
Lymphocytes Relative: 34 % (ref 12–46)
Lymphs Abs: 2.4 10*3/uL (ref 0.7–4.0)
MCV: 92.1 fL (ref 78.0–100.0)
Monocytes Absolute: 1 10*3/uL (ref 0.1–1.0)
RBC: 3.53 MIL/uL — ABNORMAL LOW (ref 3.87–5.11)
WBC: 7.1 10*3/uL (ref 4.0–10.5)

## 2013-08-03 LAB — BASIC METABOLIC PANEL
Potassium: 5.8 mEq/L — ABNORMAL HIGH (ref 3.5–5.3)
Sodium: 138 mEq/L (ref 135–145)

## 2013-08-03 NOTE — Progress Notes (Signed)
Quick Note:  LMOM for Kylie Tate [phone]) to call me back to set up OV for patient in 4-6 weeks with SF only ______

## 2013-08-05 ENCOUNTER — Other Ambulatory Visit: Payer: Self-pay | Admitting: Family Medicine

## 2013-08-05 ENCOUNTER — Other Ambulatory Visit: Payer: Medicare Other

## 2013-08-05 DIAGNOSIS — E875 Hyperkalemia: Secondary | ICD-10-CM

## 2013-08-05 DIAGNOSIS — N183 Chronic kidney disease, stage 3 unspecified: Secondary | ICD-10-CM

## 2013-08-06 LAB — BASIC METABOLIC PANEL
CO2: 22 mEq/L (ref 19–32)
Chloride: 103 mEq/L (ref 96–112)
Creat: 1.57 mg/dL — ABNORMAL HIGH (ref 0.50–1.10)

## 2013-08-10 ENCOUNTER — Other Ambulatory Visit: Payer: Self-pay | Admitting: Family Medicine

## 2013-08-10 NOTE — Progress Notes (Signed)
Utilization Review Complete  

## 2013-08-12 ENCOUNTER — Telehealth: Payer: Self-pay | Admitting: Family Medicine

## 2013-08-12 MED ORDER — DILTIAZEM HCL ER COATED BEADS 180 MG PO CP24: 180.0000 mg | ORAL_CAPSULE | Freq: Every morning | ORAL | Status: AC

## 2013-08-12 NOTE — Telephone Encounter (Signed)
Diltiazem CD 100 mg cap 1 QD #30

## 2013-08-12 NOTE — Telephone Encounter (Signed)
Meds refilled.

## 2013-08-13 DIAGNOSIS — N183 Chronic kidney disease, stage 3 unspecified: Secondary | ICD-10-CM

## 2013-08-13 DIAGNOSIS — E1165 Type 2 diabetes mellitus with hyperglycemia: Secondary | ICD-10-CM

## 2013-08-13 DIAGNOSIS — I129 Hypertensive chronic kidney disease with stage 1 through stage 4 chronic kidney disease, or unspecified chronic kidney disease: Secondary | ICD-10-CM

## 2013-08-13 DIAGNOSIS — IMO0001 Reserved for inherently not codable concepts without codable children: Secondary | ICD-10-CM

## 2013-08-13 DIAGNOSIS — D638 Anemia in other chronic diseases classified elsewhere: Secondary | ICD-10-CM

## 2013-08-19 ENCOUNTER — Ambulatory Visit: Payer: Medicare Other | Admitting: Gastroenterology

## 2013-08-19 DIAGNOSIS — N183 Chronic kidney disease, stage 3 unspecified: Secondary | ICD-10-CM

## 2013-08-19 DIAGNOSIS — E1165 Type 2 diabetes mellitus with hyperglycemia: Secondary | ICD-10-CM

## 2013-08-19 DIAGNOSIS — K625 Hemorrhage of anus and rectum: Secondary | ICD-10-CM

## 2013-08-19 DIAGNOSIS — Z794 Long term (current) use of insulin: Secondary | ICD-10-CM

## 2013-08-19 DIAGNOSIS — IMO0001 Reserved for inherently not codable concepts without codable children: Secondary | ICD-10-CM

## 2013-08-25 ENCOUNTER — Encounter: Payer: Self-pay | Admitting: Family Medicine

## 2013-08-27 ENCOUNTER — Telehealth: Payer: Self-pay | Admitting: Family Medicine

## 2013-08-27 MED ORDER — LOSARTAN POTASSIUM 50 MG PO TABS: 50.0000 mg | ORAL_TABLET | Freq: Every day | ORAL | Status: DC

## 2013-08-27 NOTE — Telephone Encounter (Addendum)
Patient needs her Blood pressure medicine refilled. Losartan pot .    [facility]  [address]

## 2013-08-27 NOTE — Telephone Encounter (Signed)
Medication refilled per protocol. 

## 2013-08-30 ENCOUNTER — Encounter: Payer: Self-pay | Admitting: Family Medicine

## 2013-08-30 ENCOUNTER — Ambulatory Visit (INDEPENDENT_AMBULATORY_CARE_PROVIDER_SITE_OTHER): Payer: Medicare Other | Admitting: Family Medicine

## 2013-08-30 VITALS — BP 130/80 | HR 68 | Temp 98.0°F | Resp 20 | Wt 163.0 lb

## 2013-08-30 DIAGNOSIS — N183 Chronic kidney disease, stage 3 unspecified: Secondary | ICD-10-CM

## 2013-08-30 DIAGNOSIS — F0390 Unspecified dementia without behavioral disturbance: Secondary | ICD-10-CM

## 2013-08-30 DIAGNOSIS — K5900 Constipation, unspecified: Secondary | ICD-10-CM

## 2013-08-30 NOTE — Assessment & Plan Note (Signed)
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 An assisted living facility will be great for her paperwork completed

## 2013-08-30 NOTE — Assessment & Plan Note (Signed)
Discuss with patient her renal function has worsened and she will be seen a kidney specialist

## 2013-08-30 NOTE — Progress Notes (Signed)
  Subjective:    Patient ID: Kylie Tate, female    DOB: 07-08-1941, 72 y.o.   MRN: [id number]  HPI  Patient here for interim visit and for Mini-Mental Status exam. Ethyl to form was completed and she will be entering an assisted living facility at [facility] in [facility]. PPD was placed today. She is very excited about entering the facility and she will still be close to her husband who is at the [facility] nursing facility. Her children are on board decision for her to enter the facility.  She's here with her private duty nurse Robbie Lis he states that her blood sugars have improved over the past week even though she did have gait over the weekend for her birthday. Her blood sugars have been 200 or less with the changes made by endocrinology about 3 weeks ago. Her Levemir is now 80 units and her sliding scale start with 20 units plus.  Regarding the constipation colitis she did take one dose of MiraLAX and then began to have diarrhea which is now resolved. She is followup with gastroenterology on Thursday.   Review of Systems  GEN- denies fatigue, fever, weight loss,weakness, recent illness HEENT- denies eye drainage, change in vision, nasal discharge, CVS- denies chest pain, palpitations RESP- denies SOB, cough, wheeze ABD- denies N/V, change in stools, abd pain GU- denies dysuria, hematuria, dribbling, incontinence MSK- denies joint pain, muscle aches, injury Neuro- denies headache, dizziness, syncope, seizure activity      Objective:   Physical Exam  GEN- NAD, alert and oriented x3 HEENT- PERRL, EOMI, non injected sclera, pink conjunctiva, MMM, oropharynx clear,hearing Aide- left ear CVS- RRR,1/6 Systolic murmur RESP-CTAB ABD-NABS,soft,NT,ND EXT- No edema Pulses- Radial 2+ Psych- normal affect and mood, no hallucinations, answer questions appropriately, good eye contact MMSE 21/30      Assessment & Plan:

## 2013-08-30 NOTE — Assessment & Plan Note (Signed)
Bowels now back to baseline f/u GI thursday

## 2013-08-30 NOTE — Patient Instructions (Signed)
Continue current medications Referral to kidney doctor Return on Wed to have PPD checked F/U 3 months

## 2013-09-01 ENCOUNTER — Ambulatory Visit (INDEPENDENT_AMBULATORY_CARE_PROVIDER_SITE_OTHER): Payer: Medicare Other | Admitting: Family Medicine

## 2013-09-01 DIAGNOSIS — Z111 Encounter for screening for respiratory tuberculosis: Secondary | ICD-10-CM

## 2013-09-01 MED ORDER — GABAPENTIN 100 MG PO CAPS: 100.0000 mg | ORAL_CAPSULE | Freq: Two times a day (BID) | ORAL | Status: AC

## 2013-09-01 MED ORDER — NABUMETONE 500 MG PO TABS: 500.0000 mg | ORAL_TABLET | Freq: Two times a day (BID) | ORAL | Status: DC

## 2013-09-01 MED ORDER — LEVOTHYROXINE SODIUM 50 MCG PO TABS: 50.0000 ug | ORAL_TABLET | Freq: Every day | ORAL | Status: AC

## 2013-09-01 MED ORDER — LOSARTAN POTASSIUM 50 MG PO TABS: 50.0000 mg | ORAL_TABLET | Freq: Every day | ORAL | Status: AC

## 2013-09-01 NOTE — Addendum Note (Signed)
Addended by: Legrand Rams B on: 09/01/2013 10:42 AM   Modules accepted: Orders

## 2013-09-02 ENCOUNTER — Encounter (INDEPENDENT_AMBULATORY_CARE_PROVIDER_SITE_OTHER): Payer: Self-pay

## 2013-09-02 ENCOUNTER — Encounter: Payer: Self-pay | Admitting: Gastroenterology

## 2013-09-02 ENCOUNTER — Ambulatory Visit (INDEPENDENT_AMBULATORY_CARE_PROVIDER_SITE_OTHER): Payer: Medicare Other | Admitting: Gastroenterology

## 2013-09-02 VITALS — BP 138/64 | HR 86 | Temp 97.8°F | Ht 64.0 in | Wt 163.8 lb

## 2013-09-02 DIAGNOSIS — R1314 Dysphagia, pharyngoesophageal phase: Secondary | ICD-10-CM

## 2013-09-02 DIAGNOSIS — R1310 Dysphagia, unspecified: Secondary | ICD-10-CM

## 2013-09-02 DIAGNOSIS — K5900 Constipation, unspecified: Secondary | ICD-10-CM

## 2013-09-02 DIAGNOSIS — K529 Noninfective gastroenteritis and colitis, unspecified: Secondary | ICD-10-CM

## 2013-09-02 DIAGNOSIS — K5289 Other specified noninfective gastroenteritis and colitis: Secondary | ICD-10-CM

## 2013-09-02 DIAGNOSIS — K219 Gastro-esophageal reflux disease without esophagitis: Secondary | ICD-10-CM

## 2013-09-02 NOTE — Progress Notes (Signed)
Reminder in epic °

## 2013-09-02 NOTE — Progress Notes (Signed)
cc'd to pcp 

## 2013-09-02 NOTE — Assessment & Plan Note (Signed)
SX CONTROLLED.  Use Prilosec 30 minutes prior to your first meal. LOW FAT DIET OPV IN 6 MOS

## 2013-09-02 NOTE — Patient Instructions (Signed)
Use Prilosec 30 minutes prior to your first meal.  FOLLOW A HIGH FIBER DIET. SEE INFO BELOW.   DRINK WATER TO KEEP YOUR URINE LIGHT YELLOW.  FOLLOW UP IN 6 MOS. MERRY CHRISTMAS AND HAPPY NEW YEAR!   High-Fiber Diet A high-fiber diet changes your normal diet to include more whole grains, legumes, fruits, and vegetables. Changes in the diet involve replacing refined carbohydrates with unrefined foods. The calorie level of the diet is essentially unchanged. The Dietary Reference Intake (recommended amount) for adult males is 38 grams per day. For adult females, it is 25 grams per day. Pregnant and lactating women should consume 28 grams of fiber per day. Fiber is the intact part of a plant that is not broken down during digestion. Functional fiber is fiber that has been isolated from the plant to provide a beneficial effect in the body. PURPOSE  Increase stool bulk.   Ease and regulate bowel movements.   Lower cholesterol.  INDICATIONS THAT YOU NEED MORE FIBER  Constipation and hemorrhoids.   Uncomplicated diverticulosis (intestine condition) and irritable bowel syndrome.   Weight management.   As a protective measure against hardening of the arteries (atherosclerosis), diabetes, and cancer.   GUIDELINES FOR INCREASING FIBER IN THE DIET  Start adding fiber to the diet slowly. A gradual increase of about 5 more grams (2 slices of whole-wheat bread, 2 servings of most fruits or vegetables, or 1 bowl of high-fiber cereal) per day is best. Too rapid an increase in fiber may result in constipation, flatulence, and bloating.   Drink enough water and fluids to keep your urine clear or pale yellow. Water, juice, or caffeine-free drinks are recommended. Not drinking enough fluid may cause constipation.   Eat a variety of high-fiber foods rather than one type of fiber.   Try to increase your intake of fiber through using high-fiber foods rather than fiber pills or supplements that contain  small amounts of fiber.   The goal is to change the types of food eaten. Do not supplement your present diet with high-fiber foods, but replace foods in your present diet.  INCLUDE A VARIETY OF FIBER SOURCES  Replace refined and processed grains with whole grains, canned fruits with fresh fruits, and incorporate other fiber sources. White rice, white breads, and most bakery goods contain little or no fiber.   Brown whole-grain rice, buckwheat oats, and many fruits and vegetables are all good sources of fiber. These include: broccoli, Brussels sprouts, cabbage, cauliflower, beets, sweet potatoes, white potatoes (skin on), carrots, tomatoes, eggplant, squash, berries, fresh fruits, and dried fruits.   Cereals appear to be the richest source of fiber. Cereal fiber is found in whole grains and bran. Bran is the fiber-rich outer coat of cereal grain, which is largely removed in refining. In whole-grain cereals, the bran remains. In breakfast cereals, the largest amount of fiber is found in those with "bran" in their names. The fiber content is sometimes indicated on the label.   You may need to include additional fruits and vegetables each day.   In baking, for 1 cup white flour, you may use the following substitutions:   1 cup whole-wheat flour minus 2 tablespoons.   1/2 cup white flour plus 1/2 cup whole-wheat flour.

## 2013-09-02 NOTE — Progress Notes (Signed)
Subjective:    Patient ID: Kylie Tate, female    DOB: [birth year], 72 y.o.   MRN: [id number]  [name], MD  HPI EGD/TCS JUN 2014-FELT  CHOKING AND MEAT GOT STICK AND DRANK 1/2 BOTTLE OF WATER TO GET IT TO WASH DOWN. HEARTBURN RESOLVED. BMs: GOOD:2X/DAY-NL(#3). MAY HAVE TO STRAIN BUT IT COMES OUT GOOD.   PT DENIES FEVER, CHILLS, BRBPR, nausea, vomiting, melena, diarrhea, constipation, abd pain, OR problems with sedation.  Past Medical History  Diagnosis Date  . Diabetes mellitus   . Hypertension   . GERD (gastroesophageal reflux disease)   . Hyperlipidemia   . Diverticulitis   . Thyroid disease     multinodular goiter  . Chronic pancreatitis   . Depression   . CRI (chronic renal insufficiency)   . Anemia, chronic disease   . Vertigo   . Diverticulosis   . Dementia 2014    Memory changes/vascular  . Hearing aid worn    Past Surgical History  Procedure Laterality Date  . Nissen fundoplication    . Abdominal exploration surgery      abd pain-diverticulitis  . Colonoscopy  06/12/2000    Brodie-diffuse melanosis coli/mucousy exudate throughtout colon s/p bx (benign)  . Cholecystectomy  2001  . Colonoscopy with esophagogastroduodenoscopy (egd) N/A 03/10/2013    WUJ:[id number] sized internal hemorrhoids/INCOMPLETE COLONOSCOPY. EGD showed mild erosive reflux esophagitis, no Barrett's. Erosive gastritis related to aspirin and Relafen, no H. pylori.  [name] dilation N/A 03/10/2013    Procedure: [name] DILATION;  Surgeon: West Bali, MD;  [facility];  Service: Endoscopy;  Laterality: N/A;  . Savory dilation N/A 03/10/2013    Procedure: SAVORY DILATION;  Surgeon: West Bali, MD;  Location: [facility];  Service: Endoscopy;  Laterality: N/A;  . Colonoscopy N/A 04/26/2013    GNF:AOZH diverticulosis was noted in the sigmoid colon/Small internal hemorrhoids/The colon IS redundant    No Known Allergies  Current Outpatient Prescriptions  Medication Sig  Dispense Refill  . aspirin EC 81 MG tablet Take 81 mg by mouth daily. For Blood Thinner      . atorvastatin (LIPITOR) 10 MG tablet Take 10 mg by mouth every morning. For Cholesterol      . cycloSPORINE (RESTASIS) 0.05 % ophthalmic emulsion Place 1 drop into both eyes 2 (two) times daily.      . diazepam (VALIUM) 5 MG tablet Take 5 mg by mouth 3 (three) times daily.      Marland Kitchen diltiazem (CARDIZEM CD) 180 MG 24 hr capsule Take 1 capsule (180 mg total) by mouth every morning. For Blood Pressure    . fish oil-omega-3 fatty acids 1000 MG capsule Take 1 capsule (1 g total) by mouth 2 (two) times daily.    Marland Kitchen gabapentin (NEURONTIN) 100 MG capsule Take 1 capsule (100 mg total) by mouth 2 (two) times daily. Neuropathy    . imipramine (TOFRANIL) 10 MG tablet Take 30 mg by mouth at bedtime. For sleep and for mood    . insulin aspart (NOVOLOG) 100 UNIT/ML injection Inject into the skin See admin instructions. Injects 20 units in addition to sliding scale as directed:  151-200= 16 units 201-250= 17 units 251-300= 18 units 301-350= 19 units 351-400= 20 units 401/more= 21 units    . insulin detemir (LEVEMIR) 100 UNIT/ML injection Inject 80 Units into the skin at bedtime.     Marland Kitchen levothyroxine (SYNTHROID, LEVOTHROID) 50 MCG tablet Take 1 tablet (50 mcg total) by mouth daily.    Marland Kitchen  lipase/protease/amylase (CREON) 12000 UNITS CPEP Take 2 capsules by mouth 3 (three) times daily. Takes 2 capsules with meals and 1 with snacks.    Marland Kitchen losartan (COZAAR) 50 MG tablet Take 1 tablet (50 mg total) by mouth daily.    . Multiple Vitamins-Minerals (CENTRUM PO) Take 1 tablet by mouth daily.     . nabumetone (RELAFEN) 500 MG tablet Take 1 tablet (500 mg total) by mouth 2 (two) times daily.    Marland Kitchen omeprazole (PRILOSEC) 20 MG capsule Take 20 mg by mouth daily. For Acid Reflux/GERD      .           Review of Systems     Objective:   Physical Exam  Vitals reviewed. Constitutional: She is oriented to person, place, and time. She  appears well-nourished. No distress.  HENT:  Head: Normocephalic and atraumatic.  Mouth/Throat: Oropharynx is clear and moist. No oropharyngeal exudate.  Eyes: Pupils are equal, round, and reactive to light. No scleral icterus.  Neck: Normal range of motion. Neck supple.  Cardiovascular: Normal rate, regular rhythm and normal heart sounds.   Pulmonary/Chest: Effort normal and breath sounds normal. No respiratory distress.  Abdominal: Soft. Bowel sounds are normal. She exhibits no distension. There is no tenderness.  Musculoskeletal: She exhibits no edema.  Lymphadenopathy:    She has no cervical adenopathy.  Neurological: She is alert and oriented to person, place, and time.  NO FOCAL DEFICITS   Psychiatric: She has a normal mood and affect.          Assessment & Plan:

## 2013-09-02 NOTE — Assessment & Plan Note (Signed)
FAIRLY WELL CONTROLLED: #3 STOOL  DRINK WATER  EAT FIBER OPV IN 6 MOS

## 2013-09-02 NOTE — Assessment & Plan Note (Signed)
SX RESOLVED MOST LIKELY DUE TO INFECTION.  DRINK WATER  EAT FIBER OPV IN 6 MOS

## 2013-09-02 NOTE — Assessment & Plan Note (Signed)
ONE EPISODE IN JUL 2014  CONTINUE TO MONITOR SYMPTOMS.

## 2013-09-13 ENCOUNTER — Telehealth: Payer: Self-pay | Admitting: Family Medicine

## 2013-09-13 NOTE — Telephone Encounter (Signed)
Ok to refill 

## 2013-09-13 NOTE — Telephone Encounter (Signed)
Please have her call GI- Dr. Darrick Penna to see if she should still be taking, i dont see it on her medication list

## 2013-09-13 NOTE — Telephone Encounter (Addendum)
Patient needs her Metoclopramide 10mg . Refilled. Please call in to   [facility] [phone]

## 2013-09-14 NOTE — Telephone Encounter (Signed)
LMTRC..09/14/13

## 2013-09-15 ENCOUNTER — Telehealth: Payer: Self-pay | Admitting: Family Medicine

## 2013-09-15 NOTE — Telephone Encounter (Signed)
noted 

## 2013-09-15 NOTE — Telephone Encounter (Signed)
Patient is to be placed in an assistant living . Will contact you when I find out moore.

## 2013-09-16 NOTE — Telephone Encounter (Signed)
Spoke with her caregiver Roxan Hockey and she will call and see if she is on medication

## 2013-09-17 ENCOUNTER — Encounter (HOSPITAL_COMMUNITY): Payer: Self-pay | Admitting: Emergency Medicine

## 2013-09-17 ENCOUNTER — Emergency Department (HOSPITAL_COMMUNITY)
Admission: EM | Admit: 2013-09-17 | Discharge: 2013-09-17 | Disposition: A | Discharge status: Home / Self Care | Payer: Medicare Other | Attending: Emergency Medicine | Admitting: Emergency Medicine

## 2013-09-17 ENCOUNTER — Telehealth: Payer: Self-pay | Admitting: Family Medicine

## 2013-09-17 DIAGNOSIS — Z7982 Long term (current) use of aspirin: Secondary | ICD-10-CM | POA: Insufficient documentation

## 2013-09-17 DIAGNOSIS — Z79899 Other long term (current) drug therapy: Secondary | ICD-10-CM | POA: Insufficient documentation

## 2013-09-17 DIAGNOSIS — R739 Hyperglycemia, unspecified: Secondary | ICD-10-CM

## 2013-09-17 DIAGNOSIS — E119 Type 2 diabetes mellitus without complications: Secondary | ICD-10-CM | POA: Insufficient documentation

## 2013-09-17 DIAGNOSIS — F329 Major depressive disorder, single episode, unspecified: Secondary | ICD-10-CM | POA: Insufficient documentation

## 2013-09-17 DIAGNOSIS — N390 Urinary tract infection, site not specified: Secondary | ICD-10-CM

## 2013-09-17 DIAGNOSIS — K219 Gastro-esophageal reflux disease without esophagitis: Secondary | ICD-10-CM | POA: Insufficient documentation

## 2013-09-17 DIAGNOSIS — R42 Dizziness and giddiness: Secondary | ICD-10-CM | POA: Insufficient documentation

## 2013-09-17 DIAGNOSIS — Z794 Long term (current) use of insulin: Secondary | ICD-10-CM | POA: Insufficient documentation

## 2013-09-17 DIAGNOSIS — Z862 Personal history of diseases of the blood and blood-forming organs and certain disorders involving the immune mechanism: Secondary | ICD-10-CM | POA: Insufficient documentation

## 2013-09-17 DIAGNOSIS — F0390 Unspecified dementia without behavioral disturbance: Secondary | ICD-10-CM | POA: Insufficient documentation

## 2013-09-17 DIAGNOSIS — F3289 Other specified depressive episodes: Secondary | ICD-10-CM | POA: Insufficient documentation

## 2013-09-17 DIAGNOSIS — E785 Hyperlipidemia, unspecified: Secondary | ICD-10-CM | POA: Insufficient documentation

## 2013-09-17 DIAGNOSIS — N189 Chronic kidney disease, unspecified: Secondary | ICD-10-CM | POA: Insufficient documentation

## 2013-09-17 DIAGNOSIS — I129 Hypertensive chronic kidney disease with stage 1 through stage 4 chronic kidney disease, or unspecified chronic kidney disease: Secondary | ICD-10-CM | POA: Insufficient documentation

## 2013-09-17 DIAGNOSIS — E042 Nontoxic multinodular goiter: Secondary | ICD-10-CM | POA: Insufficient documentation

## 2013-09-17 LAB — CBC WITH DIFFERENTIAL/PLATELET
Basophils Absolute: 0 10*3/uL (ref 0.0–0.1)
Eosinophils Relative: 1 % (ref 0–5)
HCT: 34.5 % — ABNORMAL LOW (ref 36.0–46.0)
Lymphocytes Relative: 29 % (ref 12–46)
Lymphs Abs: 2.3 10*3/uL (ref 0.7–4.0)
MCV: 91.8 fL (ref 78.0–100.0)
Monocytes Absolute: 0.4 10*3/uL (ref 0.1–1.0)
Monocytes Relative: 5 % (ref 3–12)
RDW: 12.9 % (ref 11.5–15.5)
WBC: 8.1 10*3/uL (ref 4.0–10.5)

## 2013-09-17 LAB — GLUCOSE, CAPILLARY
Glucose-Capillary: 468 mg/dL — ABNORMAL HIGH (ref 70–99)
Glucose-Capillary: 147 mg/dL — ABNORMAL HIGH (ref 70–99)

## 2013-09-17 LAB — URINALYSIS, ROUTINE W REFLEX MICROSCOPIC
Bilirubin Urine: NEGATIVE
Ketones, ur: NEGATIVE mg/dL
Leukocytes, UA: NEGATIVE
Nitrite: NEGATIVE

## 2013-09-17 LAB — TROPONIN I: Troponin I: <0.3 ng/mL (ref <0.30)

## 2013-09-17 LAB — COMPREHENSIVE METABOLIC PANEL
BUN: 48 mg/dL — ABNORMAL HIGH (ref 6–23)
CO2: 26 mEq/L (ref 19–32)
Calcium: 9.7 mg/dL (ref 8.4–10.5)
Creatinine, Ser: 1.61 mg/dL — ABNORMAL HIGH (ref 0.50–1.10)
GFR calc Af Amer: 36 mL/min — ABNORMAL LOW (ref >90)
GFR calc non Af Amer: 31 mL/min — ABNORMAL LOW (ref >90)
Glucose, Bld: 620 mg/dL (ref 70–99)

## 2013-09-17 LAB — URINE MICROSCOPIC-ADD ON

## 2013-09-17 LAB — KETONES, QUALITATIVE: Acetone, Bld: NEGATIVE

## 2013-09-17 MED ORDER — INSULIN ASPART 100 UNIT/ML ~~LOC~~ SOLN
15.0000 [IU] | Freq: Once | SUBCUTANEOUS | Status: AC
  Administered 2013-09-17: 15 [IU] via SUBCUTANEOUS
  Filled 2013-09-17: qty 1

## 2013-09-17 MED ORDER — SODIUM CHLORIDE 0.9 % IV BOLUS (SEPSIS)
500.0000 mL | Freq: Once | INTRAVENOUS | Status: AC
  Administered 2013-09-17: 500 mL via INTRAVENOUS

## 2013-09-17 MED ORDER — INSULIN ASPART 100 UNIT/ML ~~LOC~~ SOLN
SUBCUTANEOUS | Status: AC
  Filled 2013-09-17: qty 1

## 2013-09-17 MED ORDER — INSULIN ASPART 100 UNIT/ML IV SOLN
5.0000 [IU] | Freq: Once | INTRAVENOUS | Status: AC
  Administered 2013-09-17: 5 [IU] via INTRAVENOUS

## 2013-09-17 MED ORDER — INSULIN ASPART 100 UNIT/ML IV SOLN
10.0000 [IU] | Freq: Once | INTRAVENOUS | Status: AC
  Administered 2013-09-17: 10 [IU] via INTRAVENOUS

## 2013-09-17 MED ORDER — DEXTROSE 5 % IV SOLN
1.0000 g | Freq: Once | INTRAVENOUS | Status: AC
  Administered 2013-09-17: 1 g via INTRAVENOUS
  Filled 2013-09-17: qty 10

## 2013-09-17 MED ORDER — SODIUM CHLORIDE 0.9 % IV BOLUS (SEPSIS): 500.0000 mL | Freq: Once | INTRAVENOUS | Status: DC

## 2013-09-17 MED ORDER — CEPHALEXIN 500 MG PO CAPS: 500.0000 mg | ORAL_CAPSULE | Freq: Four times a day (QID) | ORAL | Status: AC

## 2013-09-17 NOTE — Telephone Encounter (Signed)
LMTRC with blood sugars

## 2013-09-17 NOTE — ED Notes (Signed)
Pt states she checked her sugar and it was >600 PTA.

## 2013-09-17 NOTE — ED Notes (Signed)
Patient reports having cramps in her legs, MD notified.

## 2013-09-17 NOTE — ED Notes (Signed)
Spoke with patients daughter by telephone

## 2013-09-17 NOTE — ED Notes (Signed)
Total of 1500 of fluid in at this time.

## 2013-09-17 NOTE — ED Provider Notes (Signed)
CSN: [id number]     Arrival date & time 09/17/13  1557 History  This chart was scribed for Kylie Crease, MD by Kylie Tate, ED Scribe. This patient was seen in room APA06/APA06 and the patient's care was started at 4:00 PM   Chief Complaint  Patient presents with  . Hyperglycemia   The history is provided by the patient and a relative. No language interpreter was used.   HPI Comments: Kylie Tate is a 72 y.o. female who presents to the Emergency Department complaining of ongoing hyperglycemia with associated dizziness and frequency of urination onset 1 week. She states PTA her glycemic levels were >600. Pt states when she checked her DM levels and the were above 500. A family member that is present in the room states they are concerned she may have a possible infection. She states she was told by her PCP that she could have an infection due to being hyperglycemic. Pt states she was told she would have to see a nephrologist due to the condition of her kidneys. She is currently taking Novolog and Mevaleg. She states she has not had changes in dosage of medications besides when her PCP changed her to "10-2". Pt denies blurred vision or visual disturbances.   Past Medical History  Diagnosis Date  . Diabetes mellitus   . Hypertension   . GERD (gastroesophageal reflux disease)   . Hyperlipidemia   . Diverticulitis   . Thyroid disease     multinodular goiter  . Chronic pancreatitis   . Depression   . CRI (chronic renal insufficiency)   . Anemia, chronic disease   . Vertigo   . Diverticulosis   . Dementia 2014    Memory changes/vascular  . Hearing aid worn   . Right sided colitis 07/27/2013   Past Surgical History  Procedure Laterality Date  . Nissen fundoplication    . Abdominal exploration surgery      abd pain-diverticulitis  . Colonoscopy  06/12/2000    Brodie-diffuse melanosis coli/mucousy exudate throughtout colon s/p bx (benign)  . Cholecystectomy  2001   . Colonoscopy with esophagogastroduodenoscopy (egd) N/A 03/10/2013    NWG:[id number] sized internal hemorrhoids/INCOMPLETE COLONOSCOPY. EGD showed mild erosive reflux esophagitis, no Barrett's. Erosive gastritis related to aspirin and Relafen, no H. pylori.  [name] dilation N/A 03/10/2013    Procedure: [name] DILATION;  Surgeon: West Bali, MD;  [facility]: [facility];  Service: Endoscopy;  Laterality: N/A;  . Savory dilation N/A 03/10/2013    Procedure: SAVORY DILATION;  Surgeon: West Bali, MD;  [facility]: [facility];  Service: Endoscopy;  Laterality: N/A;  . Colonoscopy N/A 04/26/2013    MVH:QION diverticulosis was noted in the sigmoid colon/Small internal hemorrhoids/The colon IS redundant   Family History  Problem Relation Age of Onset  . Heart disease    . Arthritis    . Cancer Brother     prostate  . Diabetes    . Heart disease Mother   . Hypertension Mother   . Hyperlipidemia Mother   . Diabetes Mother   . Depression Father   . Diabetes Father   . Diabetes Sister   . Colon cancer Neg Hx   . Pancreatitis Neg Hx    History  Substance Use Topics  . Smoking status: Never Smoker   . Smokeless tobacco: Not on file  . Alcohol Use: No   OB History   Grav Para Term Preterm Abortions TAB SAB Ect Mult Living  Review of Systems  Eyes: Negative for visual disturbance.  Genitourinary: Positive for frequency.  Neurological: Positive for dizziness.    Allergies  Review of patient's allergies indicates no known allergies.  Home Medications   Current Outpatient Rx  Name  Route  Sig  Dispense  Refill  . aspirin EC 81 MG tablet   Oral   Take 81 mg by mouth daily. For Blood Thinner         . atorvastatin (LIPITOR) 10 MG tablet   Oral   Take 10 mg by mouth every morning. For Cholesterol         . cycloSPORINE (RESTASIS) 0.05 % ophthalmic emulsion   Both Eyes   Place 1 drop into both eyes 2 (two) times daily.         . diazepam (VALIUM) 5  MG tablet   Oral   Take 5 mg by mouth 3 (three) times daily.         Marland Kitchen diltiazem (CARDIZEM CD) 180 MG 24 hr capsule   Oral   Take 1 capsule (180 mg total) by mouth every morning. For Blood Pressure   30 capsule   2   . fish oil-omega-3 fatty acids 1000 MG capsule   Oral   Take 1 capsule (1 g total) by mouth 2 (two) times daily.         Marland Kitchen gabapentin (NEURONTIN) 100 MG capsule   Oral   Take 1 capsule (100 mg total) by mouth 2 (two) times daily. Neuropathy   180 capsule   1   . imipramine (TOFRANIL) 10 MG tablet   Oral   Take 30 mg by mouth at bedtime. For sleep and for mood         . insulin aspart (NOVOLOG) 100 UNIT/ML injection   Subcutaneous   Inject into the skin See admin instructions. Injects 20 units in addition to sliding scale as directed:  151-200= 16 units 201-250= 17 units 251-300= 18 units 301-350= 19 units 351-400= 20 units 401/more= 21 units         . insulin detemir (LEVEMIR) 100 UNIT/ML injection   Subcutaneous   Inject 80 Units into the skin at bedtime.          Marland Kitchen levothyroxine (SYNTHROID, LEVOTHROID) 50 MCG tablet   Oral   Take 1 tablet (50 mcg total) by mouth daily.   90 tablet   1   . lipase/protease/amylase (CREON) 12000 UNITS CPEP   Oral   Take 2 capsules by mouth 3 (three) times daily. Takes 2 capsules with meals and 1 with snacks.         Marland Kitchen losartan (COZAAR) 50 MG tablet   Oral   Take 1 tablet (50 mg total) by mouth daily.   90 tablet   1   . Multiple Vitamins-Minerals (CENTRUM PO)   Oral   Take 1 tablet by mouth daily.          . nabumetone (RELAFEN) 500 MG tablet   Oral   Take 1 tablet (500 mg total) by mouth 2 (two) times daily.   180 tablet   1   . omeprazole (PRILOSEC) 20 MG capsule   Oral   Take 20 mg by mouth daily. For Acid Reflux/GERD         . ranitidine (ZANTAC) 150 MG tablet   Oral   Take 150 mg by mouth daily.          Triage Vitals:BP 178/68  Pulse 80  Temp(Src)  98 F (36.7 C) (Oral)   Resp 16  SpO2 92%T  Physical Exam  Constitutional: She is oriented to person, place, and time. She appears well-developed and well-nourished. No distress.  HENT:  Head: Normocephalic and atraumatic.  Right Ear: Hearing normal.  Left Ear: Hearing normal.  Nose: Nose normal.  Mouth/Throat: Oropharynx is clear and moist and mucous membranes are normal.  Eyes: Conjunctivae and EOM are normal. Pupils are equal, round, and reactive to light.  Neck: Normal range of motion. Neck supple.  Cardiovascular: Regular rhythm, S1 normal and S2 normal.  Exam reveals no gallop and no friction rub.   No murmur heard. Pulmonary/Chest: Effort normal and breath sounds normal. No respiratory distress. She exhibits no tenderness.  Abdominal: Soft. Normal appearance and bowel sounds are normal. There is no hepatosplenomegaly. There is no tenderness. There is no rebound, no guarding, no tenderness at McBurney's point and negative Murphy's sign. No hernia.  Musculoskeletal: Normal range of motion.  Neurological: She is alert and oriented to person, place, and time. She has normal strength. No cranial nerve deficit or sensory deficit. Coordination normal. GCS eye subscore is 4. GCS verbal subscore is 5. GCS motor subscore is 6.  Skin: Skin is warm, dry and intact. No rash noted. No cyanosis.  Psychiatric: She has a normal mood and affect. Her speech is normal and behavior is normal. Thought content normal.    ED Course  Procedures  DIAGNOSTIC STUDIES: Oxygen Saturation is 92% on RA, adequate by my interpretation.    COORDINATION OF CARE: 4:11 PM-Discussed treatment plan which includes labs and EKG. Provide pt with saline IV fluids while in ED. Pt agreed to plan.   Labs Review Labs Reviewed  CBC WITH DIFFERENTIAL - Abnormal; Notable for the following:    RBC 3.76 (*)    Hemoglobin 11.2 (*)    HCT 34.5 (*)    All other components within normal limits  COMPREHENSIVE METABOLIC PANEL - Abnormal; Notable for the  following:    Sodium 129 (*)    Chloride 91 (*)    Glucose, Bld 620 (*)    BUN 48 (*)    Creatinine, Ser 1.61 (*)    GFR calc non Af Amer 31 (*)    GFR calc Af Amer 36 (*)    All other components within normal limits  URINALYSIS, ROUTINE W REFLEX MICROSCOPIC - Abnormal; Notable for the following:    Glucose, UA >1000 (*)    Protein, ur TRACE (*)    All other components within normal limits  GLUCOSE, CAPILLARY - Abnormal; Notable for the following:    Glucose-Capillary 577 (*)    All other components within normal limits  URINE MICROSCOPIC-ADD ON - Abnormal; Notable for the following:    Squamous Epithelial / LPF FEW (*)    All other components within normal limits  GLUCOSE, CAPILLARY - Abnormal; Notable for the following:    Glucose-Capillary 468 (*)    All other components within normal limits  GLUCOSE, CAPILLARY - Abnormal; Notable for the following:    Glucose-Capillary 445 (*)    All other components within normal limits  GLUCOSE, CAPILLARY - Abnormal; Notable for the following:    Glucose-Capillary 404 (*)    All other components within normal limits  GLUCOSE, CAPILLARY - Abnormal; Notable for the following:    Glucose-Capillary 316 (*)    All other components within normal limits  TROPONIN I  KETONES, QUALITATIVE   Imaging Review No results found.  EKG Interpretation  None       MDM  Diagnosis: Hyperglycemia; UTI  Patient presents to the ER for evaluation of elevated blood sugar. This reports her blood sugar has been running about 400 for several weeks. She was recently seen by her doctor on October 2 and had her insulin dosing adjusted at that time. Since then she has continued to run high. Patient comes in here today for blood sugar of 600. She feels very weak. She has not had any other specific complaints. Review systems is otherwise negative. Blood work was unremarkable other than the hyperglycemia without evidence of ketosis. Patient was hydrated here in the  ER. She was given multiple doses of insulin with improvement of her hyperglycemia. Urinalysis was equivocal. Will treat empirically as this might be her for her hyperglycemia. Patient finished her Rocephin here in the ER we'll continue outpatient Keflex. She is already on a fairly high dose of Levemir insulin. She also has a very high insulin sliding scale.I saw and evaluated the patient, reviewed the resident's note and I agree with the findings and plan. She does admit that she is not following any particular diet with her sugars. I suspect she is also not taking her insulin as prescribed as well and therefore would be hesitant to increase the dosing, as she could end up being hypoglycemic. She is to check her blood sugars frequently through the weekend and followup with her primary doctor Monday.  I personally performed the services described in this documentation, which was scribed in my presence. The recorded information has been reviewed and is accurate.    Kylie Crease, MD 09/17/13 2123

## 2013-09-17 NOTE — ED Notes (Signed)
Meal given

## 2013-09-17 NOTE — Telephone Encounter (Addendum)
Patient sugar is high . Please call her back and  Let her know if she needs to come in on Monday ? They didn't state how high they were. I tried calling patient back can't reach her.

## 2013-09-20 ENCOUNTER — Encounter: Payer: Self-pay | Admitting: Family Medicine

## 2013-09-20 ENCOUNTER — Ambulatory Visit (INDEPENDENT_AMBULATORY_CARE_PROVIDER_SITE_OTHER): Payer: Medicare Other | Admitting: Family Medicine

## 2013-09-20 ENCOUNTER — Ambulatory Visit: Payer: Medicare Other | Admitting: Family Medicine

## 2013-09-20 VITALS — BP 108/60 | HR 84 | Temp 98.1°F | Resp 18 | Ht 65.0 in | Wt 155.0 lb

## 2013-09-20 DIAGNOSIS — N390 Urinary tract infection, site not specified: Secondary | ICD-10-CM | POA: Insufficient documentation

## 2013-09-20 DIAGNOSIS — M1990 Unspecified osteoarthritis, unspecified site: Secondary | ICD-10-CM

## 2013-09-20 DIAGNOSIS — E119 Type 2 diabetes mellitus without complications: Secondary | ICD-10-CM

## 2013-09-20 MED ORDER — NABUMETONE 500 MG PO TABS: 500.0000 mg | ORAL_TABLET | Freq: Two times a day (BID) | ORAL | Status: DC

## 2013-09-20 MED ORDER — FLUCONAZOLE 150 MG PO TABS: 150.0000 mg | ORAL_TABLET | Freq: Once | ORAL | Status: AC

## 2013-09-20 NOTE — Assessment & Plan Note (Addendum)
Her UA from the ER does not show any overt infection with exception of yeast, unfortunately no culture was sent , she has 2 days left of antibiotics She does state her urine had a very foul odor I will give a dose of diflucan

## 2013-09-20 NOTE — Assessment & Plan Note (Signed)
D/c NSAIDS due to CKD Acetaminophen BID

## 2013-09-20 NOTE — Progress Notes (Signed)
  Subjective:    Patient ID: Kylie Tate, female    DOB: 1941/04/03, 72 y.o.   MRN: [id number]  HPI  Patient here to followup emergency room visit for hyperglycemia. She was diagnosed with UTI that she was not symptomatic. Her urinalysis was fairly unremarkable. She was started on Keflex. Her blood sugars fasting have been 400-500 pre-meal have been 500s. There has been a lot of difficulty regarding how much insulin she is truly taking as well as her mixing up the short acting and long-acting insulin. They're trying to get her into assisted living facility at [facility] she was evaluated by the facility however because her blood sugars were very high they would not take her at this time per patient's caregiver. No hypoglycemic episode Meds- reviewed Review of Systems   GEN- denies fatigue, fever, weight loss,weakness, recent illness HEENT- denies eye drainage, change in vision, nasal discharge, CVS- denies chest pain, palpitations RESP- denies SOB, cough, wheeze ABD- denies N/V, change in stools, abd pain GU- denies dysuria, hematuria, dribbling, incontinence Neuro- denies headache, dizziness, syncope, seizure activity      Objective:   Physical Exam GEN- NAD, alert and oriented x3 HEENT- PERRL, EOMI, non injected sclera, pink conjunctiva, MMM, oropharynx clear,hearing Aide- left ear CVS- RRR,1/6 Systolic murmur RESP-CTAB ABD-NABS,soft,NT,ND EXT- No edema       Assessment & Plan:

## 2013-09-20 NOTE — Patient Instructions (Signed)
Change Novolog to 25 units with Each Meal if your blood sugar is > than 90 Continue Levemir 80 units at bedtime  [facility]- referral  Stop the metclopramide and [place] (Relafen due to kidneys) Start TYlenol 500mg  twice a day for arthritis pain Take forms to [facility] Dr. Fransico Him office will call with appointment time [facility]- Marja Kays has been contacted F/U as previous

## 2013-09-20 NOTE — Assessment & Plan Note (Signed)
Uncontrolled, there seems to be a  significant difference between her daytime caregiver and weekend numbers when her children are caring for her She also continues to mix up insulins She needs a highly monitored setting where her meds and insulin can be dispensed on a regular basis I have spoken with Dr. Fransico Him endocrinology, He advises the dose of insulin is good but more of the administration Advised to d/c SSI, give Novolog 25 units each meal if CBG > 90 and continue Levemir 80 units  Note CBG during visit was 443, which is consistent with her home readings Plan to give 25 units upon leaving with meal, caregiver will dispense

## 2013-09-21 NOTE — Telephone Encounter (Signed)
Pt was seen in ov 09/20/13

## 2013-09-22 ENCOUNTER — Telehealth: Payer: Self-pay | Admitting: Family Medicine

## 2013-09-22 NOTE — Telephone Encounter (Signed)
I have tried to reach pt multiple times  Please call, her or one of her children  She should take Novolog 30 units with each meal Increase Levemir to 85 units  F/U Dr. Fransico Him Friday

## 2013-09-22 NOTE — Telephone Encounter (Signed)
Pt came to office with care taker and directions for increase in insulin was advised and written down for pt. And given to [facility].

## 2013-09-22 NOTE — Telephone Encounter (Signed)
Yisroel Ramming Ms. [facility] Care giver has called back to let us know that @ 9:10 am . After giving Ms. Chamblin 30 Units of Novolog  it had come down to 512 .  When she called @ 11:38 and was on the phone with Jasmine December ) I told her to check it again . At that time she told me that it was reading HIGH.

## 2013-09-22 NOTE — Telephone Encounter (Signed)
noted 

## 2013-09-22 NOTE — Telephone Encounter (Signed)
Hilda Lias from [facility] called wanting to touch base about pt, she was informed that pt is to take 30u of Novolog and 85u of Levemir and to follow up with Dr. Fransico Him on Fridday.

## 2013-09-22 NOTE — Telephone Encounter (Signed)
Pt called this AM, her CBG was 600 last night, she took her regular dose of Novolog at dinner 25 units and 80 units of levemir as prescribed She could not figure out how to page the doctor on call  CBG yesterday- F 440, L 582  D 485  Bedtime 600 Fasting this AM 527 Advised to take 30 units of Novolog this AM, instead of 25 call back in 1 hour  Has appt with Dr. Fransico Him on Friday

## 2013-09-23 ENCOUNTER — Telehealth: Payer: Self-pay | Admitting: Family Medicine

## 2013-09-23 NOTE — Telephone Encounter (Signed)
Caregiver aware the pt has yeast in urine and diflucan is ordered

## 2013-09-28 ENCOUNTER — Other Ambulatory Visit: Payer: Self-pay | Admitting: Family Medicine

## 2013-09-28 DIAGNOSIS — Z139 Encounter for screening, unspecified: Secondary | ICD-10-CM

## 2013-10-19 ENCOUNTER — Ambulatory Visit: Payer: Medicare Other | Admitting: Family Medicine

## 2013-11-04 ENCOUNTER — Encounter: Payer: Self-pay | Admitting: *Deleted

## 2013-11-22 ENCOUNTER — Inpatient Hospital Stay (HOSPITAL_COMMUNITY): Admission: RE | Admit: 2013-11-22 | Payer: Medicare Other | Source: Ambulatory Visit

## 2013-11-25 ENCOUNTER — Encounter: Payer: Self-pay | Admitting: *Deleted

## 2013-11-30 ENCOUNTER — Ambulatory Visit: Payer: Medicare Other | Admitting: Family Medicine

## 2013-12-13 ENCOUNTER — Encounter: Payer: Self-pay | Admitting: *Deleted

## 2013-12-29 ENCOUNTER — Encounter: Payer: Self-pay | Admitting: *Deleted

## 2014-02-08 NOTE — Progress Notes (Signed)
REVIEWED. BARIUM PILL ESOPHAGRAM AUG 2014-?[facility], [facility]

## 2014-08-16 ENCOUNTER — Encounter: Payer: Self-pay | Admitting: *Deleted

## 2014-08-16 LAB — BLOOD PRESSURE CHECK BP00

## 2015-03-13 IMAGING — US US SOFT TISSUE HEAD/NECK
1 series · 13 of 25 positions shown · non-contrast
Comparison: Thyroid ultrasound 02/06/2012 and 12/13/2010.

CLINICAL DATA: Goiter.  Follow-up thyroid nodules.

THYROID ULTRASOUND
TECHNIQUE: Ultrasound examination of the thyroid gland and adjacent
soft tissues was performed.

[Series 1: us soft tissue head/neck · 0.09mm/px · 13 of 57 slices shown]
[im 1/57]
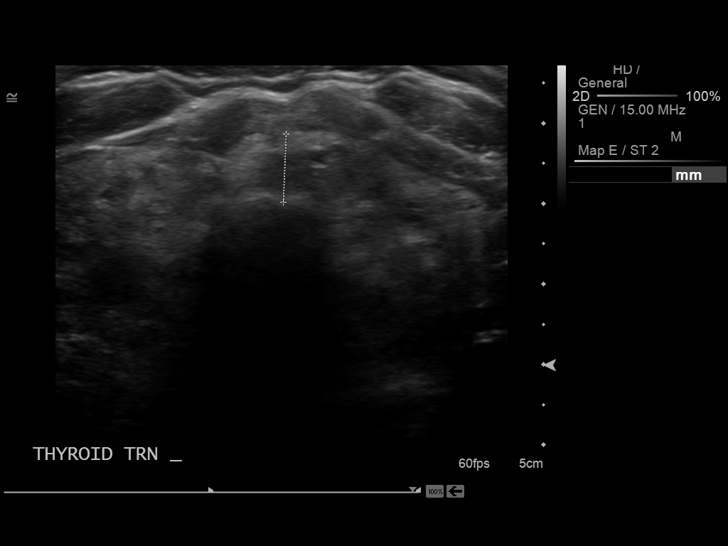
[im 5/57]
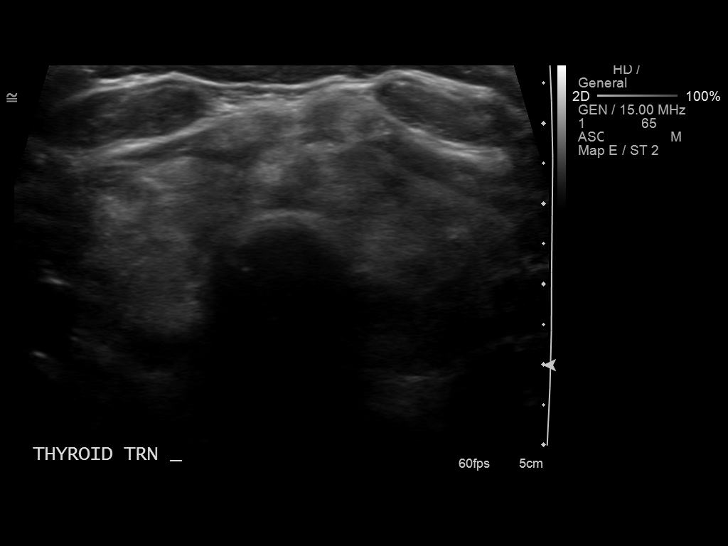
[im 10/57]
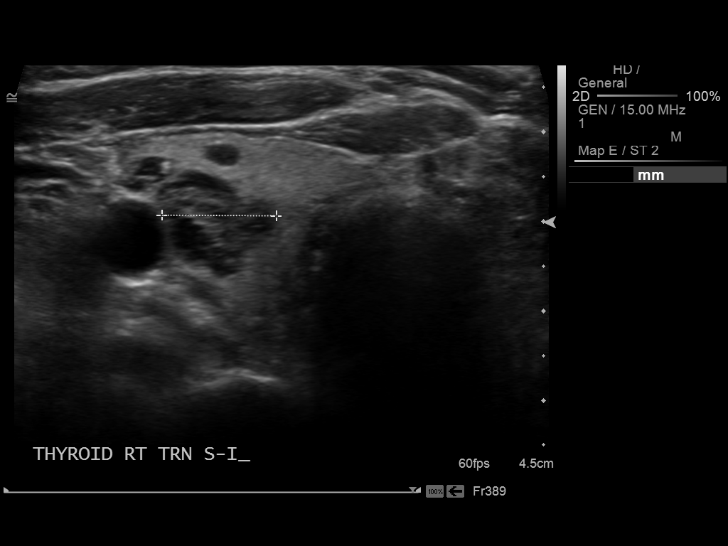
[im 15/57]
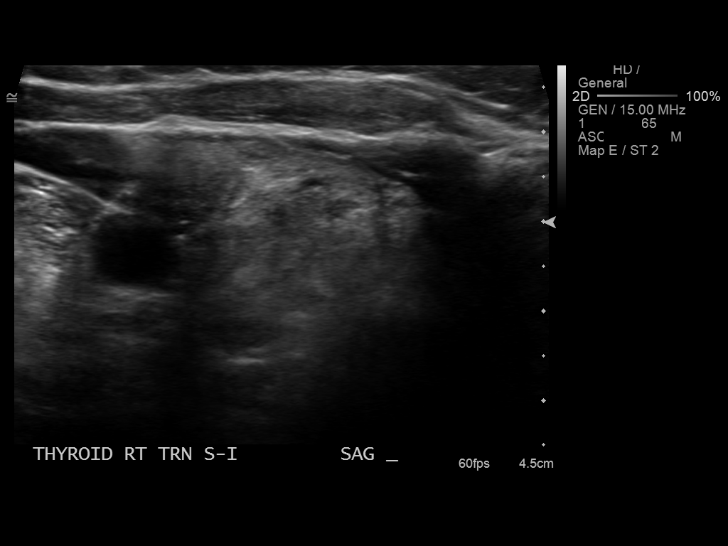
[im 19/57]
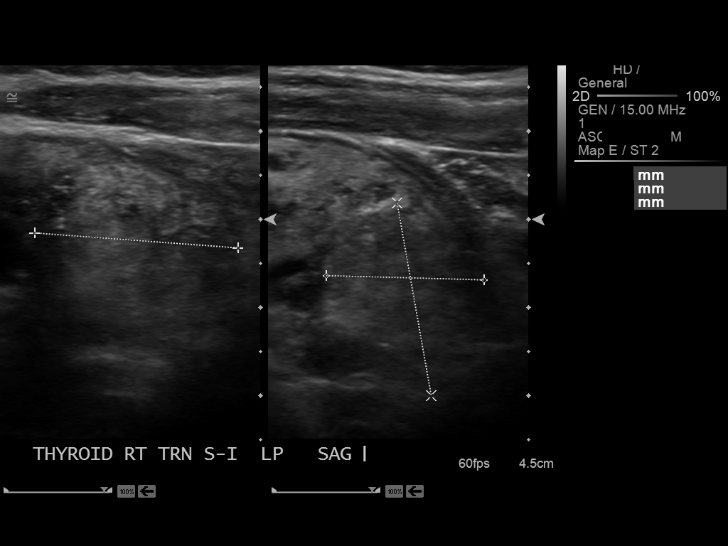
[im 24/57]
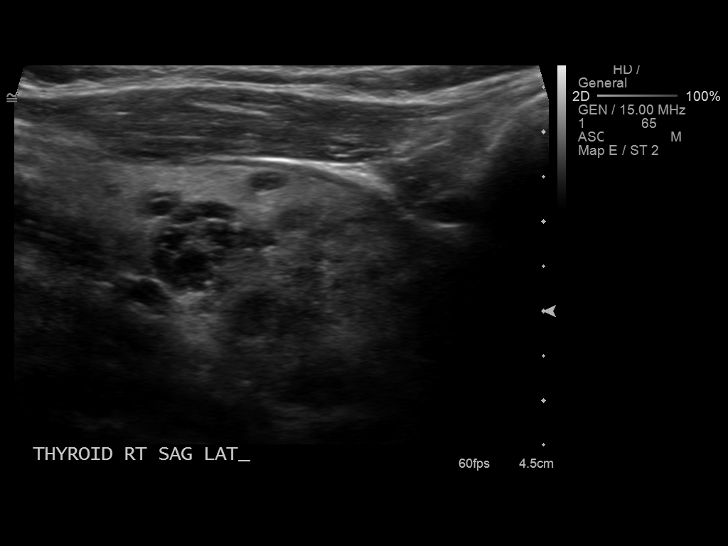
[im 29/57]
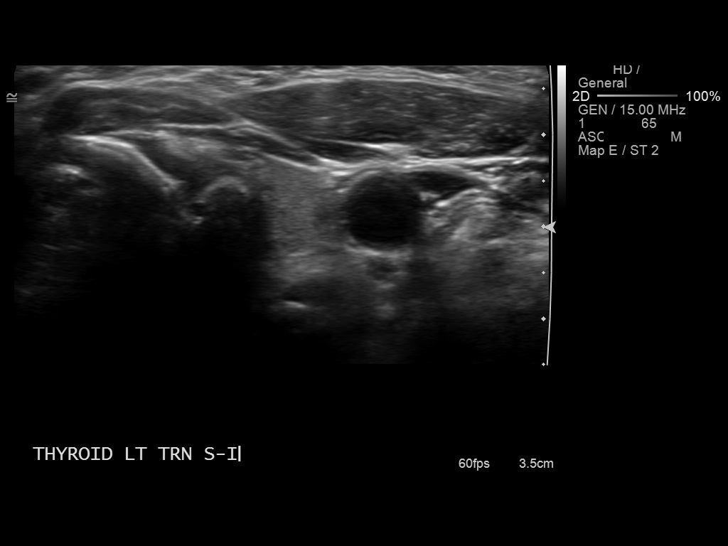
[im 33/57]
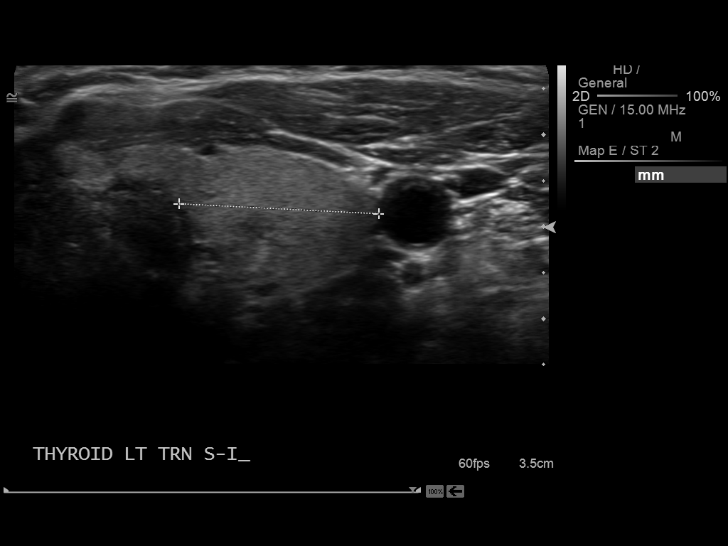
[im 38/57]
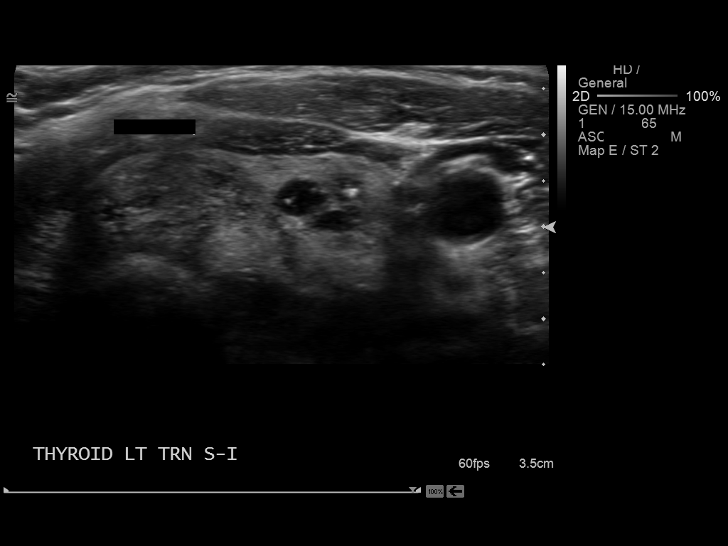
[im 43/57]
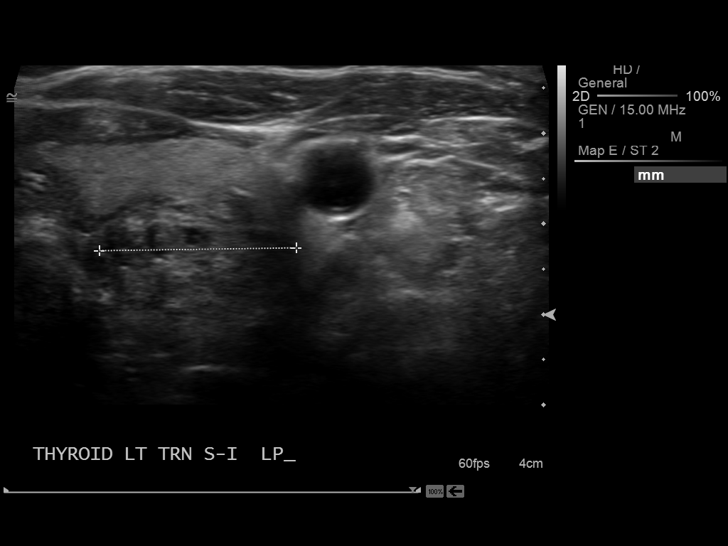
[im 47/57]
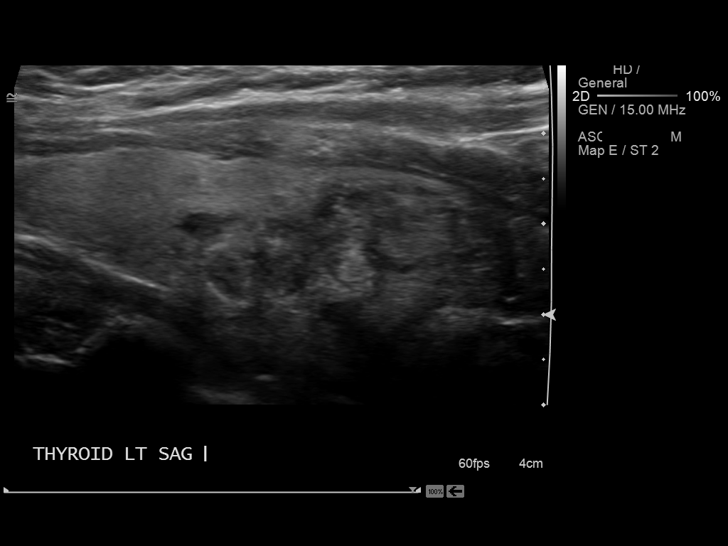
[im 52/57]
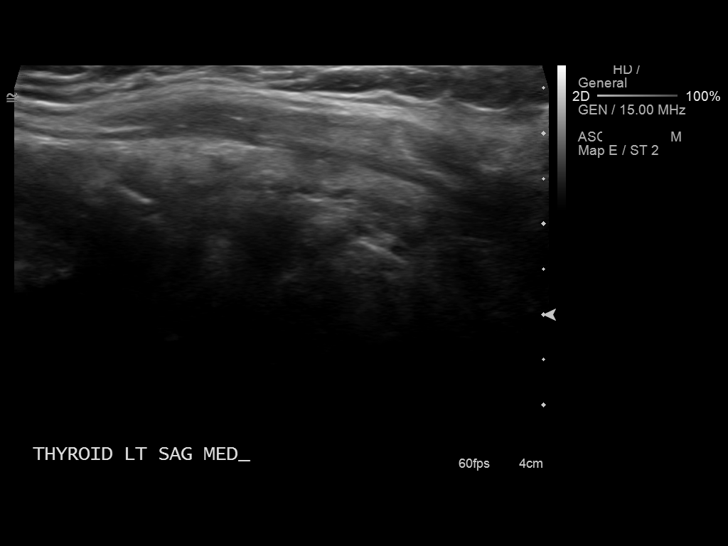
[im 57/57]
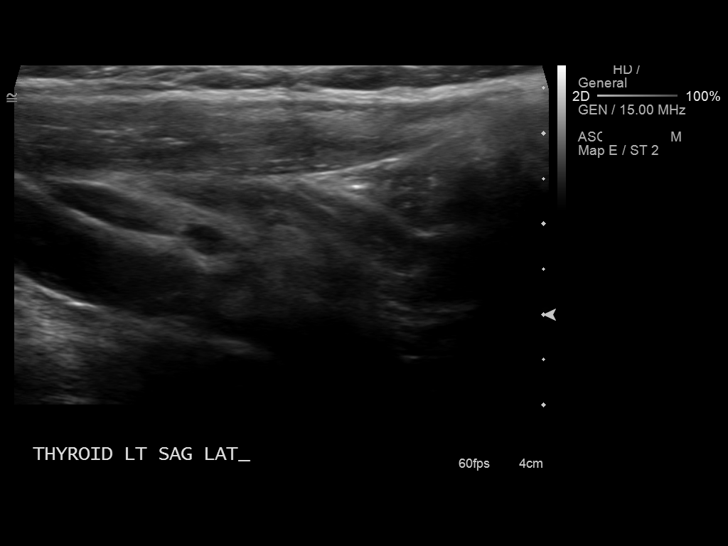

[13 of 25 positions shown; findings below may reference images not displayed]

FINDINGS: Right thyroid lobe:  Enlarged, measuring 6.3 x 3.1 x 2.7 cm.
Left thyroid lobe:  Enlarged, measuring 6.0 x 2.6 x 2.2 cm.
Isthmus:  9 mm in thickness.

Focal nodules:  The thyroid echotexture is diffusely heterogeneous.
There are multiple thyroid nodules bilaterally.  A mixed solid
cystic lesion in the mid right lobe is unchanged, measuring 1.4 x
1.3 x 1.1 cm.  There is a solid lesion inferiorly on the right
which measures 2.3 x 2.2 x 1.8 cm (previously reported as 1.5 x
x 1.2 cm).   In directly comparing the studies, the posterior
inferior margin of this nodule is ill-defined, and it appears to be
measured differently between the examinations.  A primarily solid
lesion in the left isthmus measures 1.6 x 2.2 x 1.2 cm.  Previously
this appeared more cystic, measuring 1.1 x 1.0 x 0.6 cm.  On the
left, there is a primarily solid lesion measuring 1.9 x 2.5 x
cm.  Previously, this was reported as measuring 1.0 x 1.2 x 1.2 cm
(although labeled right on the most recent study).  Again,
comparing the images, there are apparent differences between the
way these nodules are measured.

Lymphadenopathy:  None visualized.
IMPRESSION: Multinodular goiter.  The individual nodules inferiorly in both
lobes are ill-defined and difficult to accurately measure.  No
gross change is identified in comparing the images between studies.

## 2016-07-08 ENCOUNTER — Other Ambulatory Visit: Payer: Self-pay

## 2018-01-16 DEATH — deceased

## 2023-04-01 ENCOUNTER — Encounter: Payer: Self-pay | Admitting: *Deleted
